# Patient Record
Sex: Male | Born: 1975 | Race: White | Hispanic: No | Marital: Married | Smoking: Current every day smoker
Health system: Southern US, Community
[De-identification: ages and names within clinical notes are randomized; demographics above are authoritative.]

## PROBLEM LIST (undated history)

## (undated) DIAGNOSIS — J40 Bronchitis, not specified as acute or chronic: Secondary | ICD-10-CM

---

## 1998-12-25 ENCOUNTER — Encounter: Payer: Self-pay | Admitting: Emergency Medicine

## 1998-12-25 ENCOUNTER — Emergency Department (HOSPITAL_COMMUNITY): Admission: EM | Admit: 1998-12-25 | Discharge: 1998-12-25 | Payer: Self-pay | Admitting: Emergency Medicine

## 1999-01-18 ENCOUNTER — Ambulatory Visit (HOSPITAL_COMMUNITY): Admission: RE | Admit: 1999-01-18 | Discharge: 1999-01-18 | Payer: Self-pay | Admitting: Urology

## 1999-01-18 ENCOUNTER — Encounter: Payer: Self-pay | Admitting: Urology

## 1999-12-31 ENCOUNTER — Emergency Department (HOSPITAL_COMMUNITY): Admission: EM | Admit: 1999-12-31 | Discharge: 1999-12-31 | Payer: Self-pay | Admitting: Emergency Medicine

## 2000-12-02 ENCOUNTER — Emergency Department (HOSPITAL_COMMUNITY): Admission: EM | Admit: 2000-12-02 | Discharge: 2000-12-02 | Payer: Self-pay | Admitting: Emergency Medicine

## 2000-12-04 ENCOUNTER — Emergency Department (HOSPITAL_COMMUNITY): Admission: EM | Admit: 2000-12-04 | Discharge: 2000-12-04 | Payer: Self-pay

## 2000-12-05 ENCOUNTER — Emergency Department (HOSPITAL_COMMUNITY): Admission: EM | Admit: 2000-12-05 | Discharge: 2000-12-05 | Payer: Self-pay | Admitting: Emergency Medicine

## 2001-10-19 ENCOUNTER — Emergency Department (HOSPITAL_COMMUNITY): Admission: EM | Admit: 2001-10-19 | Discharge: 2001-10-19 | Payer: Self-pay | Admitting: Emergency Medicine

## 2002-08-24 ENCOUNTER — Emergency Department (HOSPITAL_COMMUNITY): Admission: EM | Admit: 2002-08-24 | Discharge: 2002-08-24 | Payer: Self-pay | Admitting: Emergency Medicine

## 2002-08-24 ENCOUNTER — Encounter: Payer: Self-pay | Admitting: Emergency Medicine

## 2002-08-29 ENCOUNTER — Emergency Department (HOSPITAL_COMMUNITY): Admission: AD | Admit: 2002-08-29 | Discharge: 2002-08-29 | Payer: Self-pay | Admitting: Emergency Medicine

## 2002-10-11 ENCOUNTER — Emergency Department (HOSPITAL_COMMUNITY): Admission: AD | Admit: 2002-10-11 | Discharge: 2002-10-11 | Payer: Self-pay | Admitting: Emergency Medicine

## 2002-10-11 ENCOUNTER — Encounter: Payer: Self-pay | Admitting: Emergency Medicine

## 2003-01-18 ENCOUNTER — Emergency Department (HOSPITAL_COMMUNITY): Admission: AD | Admit: 2003-01-18 | Discharge: 2003-01-18 | Payer: Self-pay | Admitting: Family Medicine

## 2007-02-13 ENCOUNTER — Emergency Department (HOSPITAL_COMMUNITY): Admission: EM | Admit: 2007-02-13 | Discharge: 2007-02-13 | Payer: Self-pay | Admitting: Emergency Medicine

## 2007-06-29 ENCOUNTER — Emergency Department (HOSPITAL_COMMUNITY): Admission: EM | Admit: 2007-06-29 | Discharge: 2007-06-29 | Payer: Self-pay | Admitting: Emergency Medicine

## 2008-03-28 ENCOUNTER — Emergency Department (HOSPITAL_COMMUNITY): Admission: EM | Admit: 2008-03-28 | Discharge: 2008-03-28 | Payer: Self-pay | Admitting: Emergency Medicine

## 2008-10-31 ENCOUNTER — Emergency Department (HOSPITAL_COMMUNITY): Admission: EM | Admit: 2008-10-31 | Discharge: 2008-10-31 | Payer: Self-pay | Admitting: Emergency Medicine

## 2008-11-21 ENCOUNTER — Emergency Department (HOSPITAL_COMMUNITY): Admission: EM | Admit: 2008-11-21 | Discharge: 2008-11-21 | Payer: Self-pay | Admitting: Emergency Medicine

## 2010-06-07 LAB — URINALYSIS, ROUTINE W REFLEX MICROSCOPIC
Bilirubin Urine: NEGATIVE
Glucose, UA: NEGATIVE mg/dL
Hgb urine dipstick: NEGATIVE
Ketones, ur: NEGATIVE mg/dL
Nitrite: NEGATIVE
Protein, ur: NEGATIVE mg/dL
Specific Gravity, Urine: 1.022 (ref 1.005–1.030)
Urobilinogen, UA: 1 mg/dL (ref 0.0–1.0)
pH: 7 (ref 5.0–8.0)

## 2010-06-07 LAB — BASIC METABOLIC PANEL
BUN: 9 mg/dL (ref 6–23)
CO2: 25 mEq/L (ref 19–32)
Calcium: 9.3 mg/dL (ref 8.4–10.5)
Chloride: 106 mEq/L (ref 96–112)
Creatinine, Ser: 0.78 mg/dL (ref 0.4–1.5)
GFR calc Af Amer: >60 mL/min (ref >60)
GFR calc non Af Amer: >60 mL/min (ref >60)
Glucose, Bld: 100 mg/dL — ABNORMAL HIGH (ref 70–99)
Potassium: 3.8 mEq/L (ref 3.5–5.1)
Sodium: 140 mEq/L (ref 135–145)

## 2010-12-09 LAB — RAPID STREP SCREEN (MED CTR MEBANE ONLY): Streptococcus, Group A Screen (Direct): POSITIVE — AB

## 2013-01-29 ENCOUNTER — Emergency Department (HOSPITAL_COMMUNITY): Payer: Self-pay

## 2013-01-29 ENCOUNTER — Emergency Department (HOSPITAL_COMMUNITY)
Admission: EM | Admit: 2013-01-29 | Discharge: 2013-01-30 | Disposition: A | Discharge status: Home / Self Care | Payer: Self-pay | Attending: Emergency Medicine | Admitting: Emergency Medicine

## 2013-01-29 ENCOUNTER — Encounter (HOSPITAL_COMMUNITY): Payer: Self-pay | Admitting: Emergency Medicine

## 2013-01-29 DIAGNOSIS — Y9383 Activity, rough housing and horseplay: Secondary | ICD-10-CM | POA: Insufficient documentation

## 2013-01-29 DIAGNOSIS — Y929 Unspecified place or not applicable: Secondary | ICD-10-CM | POA: Insufficient documentation

## 2013-01-29 DIAGNOSIS — S2239XA Fracture of one rib, unspecified side, initial encounter for closed fracture: Secondary | ICD-10-CM | POA: Insufficient documentation

## 2013-01-29 DIAGNOSIS — F17200 Nicotine dependence, unspecified, uncomplicated: Secondary | ICD-10-CM | POA: Insufficient documentation

## 2013-01-29 DIAGNOSIS — W1809XA Striking against other object with subsequent fall, initial encounter: Secondary | ICD-10-CM | POA: Insufficient documentation

## 2013-01-29 DIAGNOSIS — R062 Wheezing: Secondary | ICD-10-CM

## 2013-01-29 DIAGNOSIS — S2231XA Fracture of one rib, right side, initial encounter for closed fracture: Secondary | ICD-10-CM

## 2013-01-29 HISTORY — DX: Bronchitis, not specified as acute or chronic: J40

## 2013-01-29 MED ORDER — OXYCODONE-ACETAMINOPHEN 5-325 MG PO TABS: 1.0000 | ORAL_TABLET | Freq: Four times a day (QID) | ORAL | Status: DC | PRN

## 2013-01-29 MED ORDER — HYDROMORPHONE HCL PF 2 MG/ML IJ SOLN
2.0000 mg | Freq: Once | INTRAMUSCULAR | Status: AC
  Administered 2013-01-29: 2 mg via INTRAMUSCULAR
  Filled 2013-01-29: qty 1

## 2013-01-29 MED ORDER — ALBUTEROL SULFATE HFA 108 (90 BASE) MCG/ACT IN AERS
1.0000 | INHALATION_SPRAY | Freq: Four times a day (QID) | RESPIRATORY_TRACT | Status: DC | PRN
  Administered 2013-01-29: 2 via RESPIRATORY_TRACT
  Filled 2013-01-29: qty 6.7

## 2013-01-29 NOTE — ED Notes (Addendum)
Pt. accidentally hit his right chest against a brick window while " horse playing " this evening , presents with right anterior ribcage pain , no deformity/crepitance , respirations unlabored .

## 2013-01-29 NOTE — ED Provider Notes (Addendum)
CSN: [id number]     Arrival date & time 01/29/13  2126 History   First MD Initiated Contact with Patient 01/29/13 2300     Chief Complaint  Patient presents with  . Rib Injury   (Consider location/radiation/quality/duration/timing/severity/associated sxs/prior Treatment) HPI Comments: Pt used to be on meds for wheezing and bronchitis, is a current smoker, lost insurance and no longer on meds  Patient is a 37 y.o. male presenting with chest pain. The history is provided by the patient and the spouse.  Chest Pain Pain location:  R chest Pain quality: sharp and stabbing   Pain radiates to:  Does not radiate Pain radiates to the back: no   Pain severity:  Severe Onset quality:  Sudden Timing:  Constant Progression:  Unchanged Chronicity:  New Context: trauma   Relieved by:  Nothing Worsened by:  Coughing, deep breathing and movement Ineffective treatments:  None tried Associated symptoms: no abdominal pain, no cough, no diaphoresis, no dizziness, no fever, no nausea, no near-syncope, no shortness of breath, not vomiting and no weakness   Risk factors: male sex and smoking     Past Medical History  Diagnosis Date  . Bronchitis    History reviewed. No pertinent past surgical history. History reviewed. No pertinent family history. History  Substance Use Topics  . Smoking status: Current Every Day Smoker  . Smokeless tobacco: Not on file  . Alcohol Use: Yes    Review of Systems  Constitutional: Negative for fever and diaphoresis.  Respiratory: Positive for wheezing. Negative for cough and shortness of breath.   Cardiovascular: Positive for chest pain. Negative for near-syncope.  Gastrointestinal: Negative for nausea, vomiting and abdominal pain.  Neurological: Negative for dizziness and weakness.  All other systems reviewed and are negative.    Allergies  Review of patient's allergies indicates no known allergies.  Home Medications   Current Outpatient Rx  Name   Route  Sig  Dispense  Refill  . oxyCODONE-acetaminophen (PERCOCET/ROXICET) 5-325 MG per tablet   Oral   Take 1-2 tablets by mouth every 6 (six) hours as needed for severe pain.   30 tablet   0    BP 122/89  Pulse 83  Temp(Src) 98.1 F (36.7 C) (Oral)  Resp 18  Ht 6' (1.829 m)  Wt 220 lb (99.791 kg)  BMI 29.83 kg/m2  SpO2 98% Physical Exam  Nursing note and vitals reviewed. Constitutional: He appears well-developed and well-nourished. He appears distressed.  HENT:  Head: Normocephalic and atraumatic.  Pulmonary/Chest: Effort normal. He has wheezes. He has no rales. He exhibits tenderness and bony tenderness. He exhibits no crepitus, no deformity and no retraction.    Abdominal: Soft. Normal appearance. He exhibits no distension. There is tenderness in the right upper quadrant and right lower quadrant. There is no rebound, no guarding and no CVA tenderness.  Skin: He is not diaphoretic.    ED Course  Procedures (including critical care time) Labs Review Labs Reviewed - No data to display Imaging Review Dg Ribs Unilateral W/chest Right  01/29/2013   CLINICAL DATA:  Anterior rib pain after injury today.  EXAM: RIGHT RIBS AND CHEST - 3+ VIEW  COMPARISON:  None.  FINDINGS: Shallow inspiration. Normal heart size and pulmonary vascularity. No focal airspace disease in the lungs. No blunting of costophrenic angles. No pneumothorax. Suggestion of fracture of the right lateral 10th rib which is best seen on the chest view. Right ribs appear otherwise intact.  IMPRESSION: Nondisplaced fracture of the right  lateral 10th rib. No evidence of active pulmonary disease.   Electronically Signed   By: Burman Nieves M.D.   On: 01/29/2013 22:58    EKG Interpretation   None      ra sat is 98% and I interpret to be adequate MDM   1. Rib fracture, right, closed, initial encounter   2. Wheezing      Pt with focal tenderness in right ribcage after he fell and hurt his right anterior and  lateral lower chest wall, hurts worse with coughing.  Plain films reveal a non displaced 10th rib fracture on the right per radiologist.  No PTX.  Will give inhaler since pt is already wheezing, likely due to his smoking.  Will give a work note as well, Rx for pain meds.    Gavin Pound. Oletta Lamas, MD 01/29/13 2315  Gavin Pound. Abdullah Rizzi, MD 01/29/13 2316

## 2013-01-29 NOTE — Discharge Instructions (Signed)
 Using Your Inhaler Proper inhaler technique is very important. Good technique assures that the medicine reaches the lungs. Poor technique results in depositing the medicine on the tongue and back of the throat rather than in the airways. STEPS TO FOLLOW IF USING INHALER WITHOUT EXTENSION TUBE: 1. Remove cap from inhaler. 2. Shake inhaler for 5 seconds before each inhalation (breathing in). 3. Position the inhaler so that the top of the canister faces up. 4. Put your index finger on the top of the medication canister. Your thumb supports the bottom of the inhaler. 5. Open your mouth. 6. Hold the inhaler 1 to 2 inches away from your open mouth. This allows the medicine to slow down before the medicine enters the mouth. 7. Exhale (breathe out) normally and as completely as possible. 8. Press the canister down with the index finger to release the medication. 9. At the same time as the canister is pressed, inhale deeply and slowly until the lungs are completely filled. This should take 4 to 6 seconds. Keep your tongue down and out of the way. 10. Hold the medication in your lungs for up to 10 seconds (10 seconds is best). This helps the medicine get into the small airways of your lungs to work better. 11. Breathe out slowly, through pursed lips. Whistling is an example of pursed lips. 12. Wait at least 1 minute between puffs. Continue with the above steps until you have taken the number of puffs your caregiver has ordered. 13. Replace cap on inhaler. STEPS TO FOLLOW USING AN INHALER WITH AN EXTENSION (SPACER): 1.  Remove cap from inhaler. 2. Shake inhaler for 5 seconds before each inhalation (breathing in). 3. Your caregiver has asked you to use a spacer with your inhaler. A spacer is a plastic tube with a mouthpiece on one end and an opening that connects to the inhaler on the other end. A spacer helps you take the medicine better. 4. Place the open end of the spacer onto the mouthpiece of the  inhaler. 5. Position the inhaler so that the top of the canister faces up and the spacer mouthpiece faces you. 6. Put your index finger on the top of the medication canister. Your thumb supports the bottom of the inhaler and the spacer. 7. Exhale (breathe out) normally and as completely as possible. 8. Immediately after exhaling, place the spacer between your teeth and into your mouth. Close your mouth tightly around the spacer. 9. Press the canister down with the index finger to release the medication. 10. At the same time as the canister is pressed, inhale deeply and slowly until the lungs are completely filled. This should take 4 to 6 seconds. Keep your tongue down and out of the way. 11. Hold the medication in your lungs for up to 10 seconds (10 seconds is best). This helps the medicine get into the small airways of your lungs to work better. Exhale. 12. Repeat inhaling deeply through the spacer mouthpiece. Again hold that breath for up to 10 seconds (10 seconds is best). Exhale slowly. If it is difficult to take this second deep breath through the spacer, breathe normally several times through the spacer. Remove the spacer from your mouth. 13. Wait at least 1 minute between puffs. Continue with the above steps until you have taken the number of puffs your caregiver has ordered. 14. Remove spacer from the inhaler and place cap on inhaler. If you are using different kinds of inhalers, use your quick relief medicine to  open the airways 10 - 15 minutes before using a steroid. If you are unsure which inhalers to use and the order of using them, ask your caregiver, nurse, or respiratory therapist. If you are using a steroid inhaler, rinse your mouth with water after your last puff and then spit out the water. Do not swallow the water. Avoid the following:  Inhaling before or after starting the spray of medicine. It takes practice to coordinate your breathing with triggering the spray.  Inhaling  through the nose (rather than the mouth) when triggering the spray. HOW TO DETERMINE IF YOUR INHALER IS FULL OR NEARLY EMPTY:  Determine when an inhaler is empty. You cannot know when an inhaler is empty by shaking it. A few inhalers are now being made with dose counters. Ask your caregiver for a prescription that has a dose counter if you feel you need that extra help.  If your inhaler does not have a counter, check the number of doses in the inhaler before you use it. The canister or box will list the number of doses in the canister. Divide the total number of doses in the canister by the number you will use each day to find how many days the canister will last. (For example, if your canister has 200 doses and you take 2 puffs, 4 times each day, which is 8 puffs a day. Dividing 200 by 8 equals 25. The canister should last 25 days.) Using a calendar, count forward that many days to see when your inhaler will run out. Write the refill date on a calendar or your canister.  Remember, if you need to take extra doses, the inhaler will empty sooner than you figured. Be sure you have a refill before your canister runs out. Refill your inhaler 7 to 10 days before it runs out. HOME CARE INSTRUCTIONS   Do not use the inhaler more than your caregiver tells you. If you are still wheezing and are feeling tightness in your chest, call your caregiver.  Keep an adequate supply of medication. This includes making sure the medicine is not expired, and you have a spare inhaler.  Follow your caregiver or inhaler insert directions for cleaning the inhaler and spacer. SEEK MEDICAL CARE IF:   Symptoms are only partially relieved with your inhaler.  You are having trouble using your inhaler.  You experience some increase in phlegm.  You develop a fever of 100.5 F (38.1 C). SEEK IMMEDIATE MEDICAL CARE IF:   You feel little or no relief with your inhalers. You are still wheezing and are feeling shortness of  breath and/or tightness in your chest.  You have side effects such as dizziness, headaches or fast heart rate.  You have chills, fever, night sweats or an oral temperature above 102 F (38.9 C).  Phlegm production increases a lot, or there is blood in the phlegm. MAKE SURE YOU:   Understand these instructions.  Will watch your condition.  Will get help right away if you are not doing well or get worse. Document Released: 02/15/2000 Document Revised: 05/12/2011 Document Reviewed: 12/05/2008 [facility] Patient Information 2014 [place], [place].    Rib Fracture A rib fracture is a break or crack in one of the bones of the ribs. The ribs are a group of long, curved bones that wrap around your chest and attach to your spine. They protect your lungs and other organs in the chest cavity. A broken or cracked rib is often painful, but most do  not cause other problems. Most rib fractures heal on their own over time. However, rib fractures can be more serious if multiple ribs are broken or if broken ribs move out of place and push against other structures. CAUSES   A direct blow to the chest. For example, this could happen during contact sports, a car accident, or a fall against a hard object.  Repetitive movements with high force, such as pitching a baseball or having severe coughing spells. SYMPTOMS   Pain when you breathe in or cough.  Pain when someone presses on the injured area. DIAGNOSIS  Your caregiver will perform a physical exam. Various imaging tests may be ordered to confirm the diagnosis and to look for related injuries. These tests may include a chest X-ray, computed tomography (CT), magnetic resonance imaging (MRI), or a bone scan. TREATMENT  Rib fractures usually heal on their own in 1 3 months. The longer healing period is often associated with a continued cough or other aggravating activities. During the healing period, pain control is very important. Medication is usually  given to control pain. Hospitalization or surgery may be needed for more severe injuries, such as those in which multiple ribs are broken or the ribs have moved out of place.  HOME CARE INSTRUCTIONS   Avoid strenuous activity and any activities or movements that cause pain. Be careful during activities and avoid bumping the injured rib.  Gradually increase activity as directed by your caregiver.  Only take over-the-counter or prescription medications as directed by your caregiver. Do not take other medications without asking your caregiver first.  Apply ice to the injured area for the first 1 2 days after you have been treated or as directed by your caregiver. Applying ice helps to reduce inflammation and pain.  Put ice in a plastic bag.  Place a towel between your skin and the bag.   Leave the ice on for 15 20 minutes at a time, every 2 hours while you are awake.  Perform deep breathing as directed by your caregiver. This will help prevent pneumonia, which is a common complication of a broken rib. Your caregiver may instruct you to:  Take deep breaths several times a day.  Try to cough several times a day, holding a pillow against the injured area.  Use a device called an incentive spirometer to practice deep breathing several times a day.  Drink enough fluids to keep your urine clear or pale yellow. This will help you avoid constipation.   Do not wear a rib belt or binder. These restrict breathing, which can lead to pneumonia.  SEEK IMMEDIATE MEDICAL CARE IF:   You have a fever.   You have difficulty breathing or shortness of breath.   You develop a continual cough, or you cough up thick or bloody sputum.  You feel sick to your stomach (nausea), throw up (vomit), or have abdominal pain.   You have worsening pain not controlled with medications.  MAKE SURE YOU:  Understand these instructions.  Will watch your condition.  Will get help right away if you are not  doing well or get worse. Document Released: 02/17/2005 Document Revised: 10/20/2012 Document Reviewed: 04/21/2012 [facility] Patient Information 2014 [place], [place].    Narcotic and benzodiazepine use may cause drowsiness, slowed breathing or dependence.  Please use with caution and do not drive, operate machinery or watch young children alone while taking them.  Taking combinations of these medications or drinking alcohol will potentiate these effects.

## 2013-01-30 NOTE — ED Notes (Signed)
Patient has ride home with family 

## 2013-02-21 ENCOUNTER — Ambulatory Visit: Payer: Self-pay

## 2013-02-22 ENCOUNTER — Ambulatory Visit: Payer: Self-pay | Attending: Internal Medicine | Admitting: Internal Medicine

## 2013-02-22 VITALS — BP 136/84 | HR 83 | Temp 98.4°F | Resp 16 | Ht 72.0 in | Wt 223.0 lb

## 2013-02-22 DIAGNOSIS — S2249XA Multiple fractures of ribs, unspecified side, initial encounter for closed fracture: Secondary | ICD-10-CM

## 2013-02-22 DIAGNOSIS — S2239XA Fracture of one rib, unspecified side, initial encounter for closed fracture: Secondary | ICD-10-CM | POA: Insufficient documentation

## 2013-02-22 DIAGNOSIS — X58XXXA Exposure to other specified factors, initial encounter: Secondary | ICD-10-CM | POA: Insufficient documentation

## 2013-02-22 DIAGNOSIS — S2241XA Multiple fractures of ribs, right side, initial encounter for closed fracture: Secondary | ICD-10-CM

## 2013-02-22 LAB — COMPLETE METABOLIC PANEL WITH GFR
ALT: 24 U/L (ref 0–53)
AST: 20 U/L (ref 0–37)
Alkaline Phosphatase: 57 U/L (ref 39–117)
Calcium: 9.8 mg/dL (ref 8.4–10.5)
Chloride: 102 mEq/L (ref 96–112)
Creat: 0.72 mg/dL (ref 0.50–1.35)

## 2013-02-22 LAB — CBC WITH DIFFERENTIAL/PLATELET
Eosinophils Absolute: 0.4 10*3/uL (ref 0.0–0.7)
Eosinophils Relative: 4 % (ref 0–5)
HCT: 47 % (ref 39.0–52.0)
Hemoglobin: 16.1 g/dL (ref 13.0–17.0)
Lymphocytes Relative: 26 % (ref 12–46)
Lymphs Abs: 2.9 10*3/uL (ref 0.7–4.0)
MCH: 30.4 pg (ref 26.0–34.0)
MCV: 88.7 fL (ref 78.0–100.0)
Monocytes Absolute: 0.8 10*3/uL (ref 0.1–1.0)
Monocytes Relative: 7 % (ref 3–12)
Platelets: 203 10*3/uL (ref 150–400)
RBC: 5.3 MIL/uL (ref 4.22–5.81)
WBC: 11 10*3/uL — ABNORMAL HIGH (ref 4.0–10.5)

## 2013-02-22 LAB — LIPID PANEL
HDL: 42 mg/dL (ref >39)
LDL Cholesterol: 129 mg/dL — ABNORMAL HIGH (ref 0–99)
Total CHOL/HDL Ratio: 4.6 Ratio
Triglycerides: 119 mg/dL (ref <150)
VLDL: 24 mg/dL (ref 0–40)

## 2013-02-22 LAB — TSH: TSH: 2.994 u[IU]/mL (ref 0.350–4.500)

## 2013-02-22 MED ORDER — DICLOFENAC SODIUM 3 % TD GEL: 1.0000 | Freq: Two times a day (BID) | TRANSDERMAL | Status: DC

## 2013-02-22 NOTE — Progress Notes (Signed)
Pt is here to establish care. Pt broke his ribs 4 weeks ago and he is following up care here.

## 2013-02-22 NOTE — Progress Notes (Signed)
Patient ID: Frank Chavez, male   DOB: 1975-11-06, 37 y.o.   MRN: [id number]   CC:  HPI: 37 year old male presenting to establish care. He was recently in the ER with a refracture right, nondisplaced 10th rib fracture. No pneumothorax. At that time he had wheezing and was prescribed albuterol inhaler. Now he has localized pain. He has a history of left ureteropelvic junction stenosis. He has not had his kidney function checked in about 4 years.  No Known Allergies Past Medical History  Diagnosis Date  . Bronchitis    Current Outpatient Prescriptions on File Prior to Visit  Medication Sig Dispense Refill  . oxyCODONE-acetaminophen (PERCOCET/ROXICET) 5-325 MG per tablet Take 1-2 tablets by mouth every 6 (six) hours as needed for severe pain.  30 tablet  0   No current facility-administered medications on file prior to visit.   Family History  Problem Relation Age of Onset  . Hypertension Mother   . Hypertension Father    History   Social History  . Marital Status: Married    Spouse Name: N/A    Number of Children: N/A  . Years of Education: N/A   Occupational History  . Not on file.   Social History Main Topics  . Smoking status: Current Every Day Smoker  . Smokeless tobacco: Not on file  . Alcohol Use: Yes  . Drug Use: No  . Sexual Activity: Not on file   Other Topics Concern  . Not on file   Social History Narrative  . No narrative on file    Review of Systems  Constitutional: Negative for fever, chills, diaphoresis, activity change, appetite change and fatigue.  HENT: Negative for ear pain, nosebleeds, congestion, facial swelling, rhinorrhea, neck pain, neck stiffness and ear discharge.   Eyes: Negative for pain, discharge, redness, itching and visual disturbance.  Respiratory: Negative for cough, choking, chest tightness, shortness of breath, wheezing and stridor.   Cardiovascular: Positive for chest pain, palpitations and leg swelling.  Gastrointestinal:  Negative for abdominal distention.  Genitourinary: Negative for dysuria, urgency, frequency, hematuria, flank pain, decreased urine volume, difficulty urinating and dyspareunia.  Musculoskeletal: Negative for back pain, joint swelling, arthralgias and gait problem.  Neurological: Negative for dizziness, tremors, seizures, syncope, facial asymmetry, speech difficulty, weakness, light-headedness, numbness and headaches.  Hematological: Negative for adenopathy. Does not bruise/bleed easily.  Psychiatric/Behavioral: Negative for hallucinations, behavioral problems, confusion, dysphoric mood, decreased concentration and agitation.    Objective:   Filed Vitals:   02/22/13 1411  BP: 136/84  Pulse: 83  Temp: 98.4 F (36.9 C)  Resp: 16    Physical Exam  Constitutional: Appears well-developed and well-nourished. No distress.  HENT: Normocephalic. External right and left ear normal. Oropharynx is clear and moist.  Eyes: Conjunctivae and EOM are normal. PERRLA, no scleral icterus.  Neck: Normal ROM. Neck supple. No JVD. No tracheal deviation. No thyromegaly.  CVS: RRR, S1/S2 +, no murmurs, no gallops, no carotid bruit.  Pulmonary: Effort and breath sounds normal, no stridor, rhonchi, wheezes, rales.  Abdominal: Soft. BS +,  no distension, tenderness, rebound or guarding.  Musculoskeletal: Normal range of motion. No edema and no tenderness.  Lymphadenopathy: No lymphadenopathy noted, cervical, inguinal. Neuro: Alert. Normal reflexes, muscle tone coordination. No cranial nerve deficit. Skin: Skin is warm and dry. No rash noted. Not diaphoretic. No erythema. No pallor.  Psychiatric: Normal mood and affect. Behavior, judgment, thought content normal.   No results found for this basename: WBC, HGB, HCT, MCV, PLT  Lab Results  Component Value Date   CREATININE 0.78 11/21/2008   BUN 9 11/21/2008   NA 140 11/21/2008   K 3.8 SLIGHT HEMOLYSIS 11/21/2008   CL 106 11/21/2008   CO2 25 11/21/2008    No  results found for this basename: HGBA1C   Lipid Panel  No results found for this basename: chol, trig, hdl, cholhdl, vldl, ldlcalc       Assessment and plan:   There are no active problems to display for this patient.   Rib fracture We'll repeat x-rays to rule out underlying pneumothorax, document healing Diclofenac gel to be applied locally Counseled about that of spirometry Report any worsening chest pain or shortness of breath or fever  Establish care Will do baseline labs Refusing flu vaccination Counseled about smoking cessation   The patient was given clear instructions to go to ER or return to medical center if symptoms don't improve, worsen or new problems develop. The patient verbalized understanding. The patient was told to call to get any lab results if not heard anything in the next week.

## 2013-02-23 ENCOUNTER — Ambulatory Visit (HOSPITAL_COMMUNITY)
Admission: RE | Admit: 2013-02-23 | Discharge: 2013-02-23 | Disposition: A | Discharge status: Home / Self Care | Payer: Self-pay | Source: Ambulatory Visit | Attending: Internal Medicine | Admitting: Internal Medicine

## 2013-02-23 DIAGNOSIS — S2239XA Fracture of one rib, unspecified side, initial encounter for closed fracture: Secondary | ICD-10-CM | POA: Insufficient documentation

## 2013-02-23 DIAGNOSIS — X58XXXA Exposure to other specified factors, initial encounter: Secondary | ICD-10-CM | POA: Insufficient documentation

## 2013-02-23 DIAGNOSIS — S2241XA Multiple fractures of ribs, right side, initial encounter for closed fracture: Secondary | ICD-10-CM

## 2013-02-23 MED ORDER — DICLOFENAC SODIUM 3 % TD GEL: 1.0000 | Freq: Two times a day (BID) | TRANSDERMAL | Status: AC

## 2013-02-23 NOTE — Addendum Note (Signed)
Addended by: Susie Cassette MD, Germain Osgood on: 02/23/2013 09:48 AM   Modules accepted: Orders

## 2013-02-28 ENCOUNTER — Telehealth: Payer: Self-pay | Admitting: *Deleted

## 2013-02-28 NOTE — Telephone Encounter (Signed)
Message copied by Naftula Donahue, [place] R on Mon Feb 28, 2013  3:22 PM ------      Message from: Susie Cassette MD, [facility]      Created: Mon Feb 28, 2013  3:11 PM       Please notify patient, that all labs are within normal limits ------

## 2013-02-28 NOTE — Telephone Encounter (Signed)
Pt called back and was given his lab results. Call completed effectively.

## 2013-02-28 NOTE — Telephone Encounter (Signed)
Contacted pt to inform him of his lab results. Left a voicemail for the pt to give [place] a call back.

## 2013-05-23 ENCOUNTER — Ambulatory Visit: Payer: Self-pay | Attending: Internal Medicine | Admitting: Internal Medicine

## 2013-05-23 ENCOUNTER — Encounter: Payer: Self-pay | Admitting: Internal Medicine

## 2013-05-23 VITALS — BP 131/81 | HR 84 | Temp 98.7°F | Resp 16 | Wt 244.4 lb

## 2013-05-23 DIAGNOSIS — F17200 Nicotine dependence, unspecified, uncomplicated: Secondary | ICD-10-CM | POA: Insufficient documentation

## 2013-05-23 DIAGNOSIS — Z09 Encounter for follow-up examination after completed treatment for conditions other than malignant neoplasm: Secondary | ICD-10-CM

## 2013-05-23 DIAGNOSIS — E785 Hyperlipidemia, unspecified: Secondary | ICD-10-CM | POA: Insufficient documentation

## 2013-05-23 DIAGNOSIS — IMO0001 Reserved for inherently not codable concepts without codable children: Secondary | ICD-10-CM | POA: Insufficient documentation

## 2013-05-23 MED ORDER — NICOTINE 21 MG/24HR TD PT24: 21.0000 mg | MEDICATED_PATCH | Freq: Every day | TRANSDERMAL | Status: AC

## 2013-05-23 NOTE — Patient Instructions (Signed)
Fat and Cholesterol Control Diet  Fat and cholesterol levels in your blood and organs are influenced by your diet. High levels of fat and cholesterol may lead to diseases of the heart, small and large blood vessels, gallbladder, liver, and pancreas.  CONTROLLING FAT AND CHOLESTEROL WITH DIET  Although exercise and lifestyle factors are important, your diet is key. That is because certain foods are known to raise cholesterol and others to lower it. The goal is to balance foods for their effect on cholesterol and more importantly, to replace saturated and trans fat with other types of fat, such as monounsaturated fat, polyunsaturated fat, and omega-3 fatty acids.  On average, a person should consume no more than 15 to 17 g of saturated fat daily. Saturated and trans fats are considered "bad" fats, and they will raise LDL cholesterol. Saturated fats are primarily found in animal products such as meats, butter, and cream. However, that does not mean you need to give up all your favorite foods. Today, there are good tasting, low-fat, low-cholesterol substitutes for most of the things you like to eat. Choose low-fat or nonfat alternatives. Choose round or loin cuts of red meat. These types of cuts are lowest in fat and cholesterol. Chicken (without the skin), fish, veal, and ground turkey breast are great choices. Eliminate fatty meats, such as hot dogs and salami. Even shellfish have little or no saturated fat. Have a 3 oz (85 g) portion when you eat lean meat, poultry, or fish.  Trans fats are also called "partially hydrogenated oils." They are oils that have been scientifically manipulated so that they are solid at room temperature resulting in a longer shelf life and improved taste and texture of foods in which they are added. Trans fats are found in stick margarine, some tub margarines, cookies, crackers, and baked goods.   When baking and cooking, oils are a great substitute for butter. The monounsaturated oils are  especially beneficial since it is believed they lower LDL and raise HDL. The oils you should avoid entirely are saturated tropical oils, such as coconut and palm.   Remember to eat a lot from food groups that are naturally free of saturated and trans fat, including fish, fruit, vegetables, beans, grains (barley, rice, couscous, bulgur wheat), and pasta (without cream sauces).   IDENTIFYING FOODS THAT LOWER FAT AND CHOLESTEROL   Soluble fiber may lower your cholesterol. This type of fiber is found in fruits such as apples, vegetables such as broccoli, potatoes, and carrots, legumes such as beans, peas, and lentils, and grains such as barley. Foods fortified with plant sterols (phytosterol) may also lower cholesterol. You should eat at least 2 g per day of these foods for a cholesterol lowering effect.   Read package labels to identify low-saturated fats, trans fat free, and low-fat foods at the supermarket. Select cheeses that have only 2 to 3 g saturated fat per ounce. Use a heart-healthy tub margarine that is free of trans fats or partially hydrogenated oil. When buying baked goods (cookies, crackers), avoid partially hydrogenated oils. Breads and muffins should be made from whole grains (whole-wheat or whole oat flour, instead of "flour" or "enriched flour"). Buy non-creamy canned soups with reduced salt and no added fats.   FOOD PREPARATION TECHNIQUES   Never deep-fry. If you must fry, either stir-fry, which uses very little fat, or use non-stick cooking sprays. When possible, broil, bake, or roast meats, and steam vegetables. Instead of putting butter or margarine on vegetables, use lemon   and herbs, applesauce, and cinnamon (for squash and sweet potatoes). Use nonfat yogurt, salsa, and low-fat dressings for salads.   LOW-SATURATED FAT / LOW-FAT FOOD SUBSTITUTES  Meats / Saturated Fat (g)  · Avoid: Steak, marbled (3 oz/85 g) / 11 g  · Choose: Steak, lean (3 oz/85 g) / 4 g  · Avoid: Hamburger (3 oz/85 g) / 7  g  · Choose: Hamburger, lean (3 oz/85 g) / 5 g  · Avoid: Ham (3 oz/85 g) / 6 g  · Choose: Ham, lean cut (3 oz/85 g) / 2.4 g  · Avoid: Chicken, with skin, dark meat (3 oz/85 g) / 4 g  · Choose: Chicken, skin removed, dark meat (3 oz/85 g) / 2 g  · Avoid: Chicken, with skin, light meat (3 oz/85 g) / 2.5 g  · Choose: Chicken, skin removed, light meat (3 oz/85 g) / 1 g  Dairy / Saturated Fat (g)  · Avoid: Whole milk (1 cup) / 5 g  · Choose: Low-fat milk, 2% (1 cup) / 3 g  · Choose: Low-fat milk, 1% (1 cup) / 1.5 g  · Choose: Skim milk (1 cup) / 0.3 g  · Avoid: Hard cheese (1 oz/28 g) / 6 g  · Choose: Skim milk cheese (1 oz/28 g) / 2 to 3 g  · Avoid: Cottage cheese, 4% fat (1 cup) / 6.5 g  · Choose: Low-fat cottage cheese, 1% fat (1 cup) / 1.5 g  · Avoid: Ice cream (1 cup) / 9 g  · Choose: Sherbet (1 cup) / 2.5 g  · Choose: Nonfat frozen yogurt (1 cup) / 0.3 g  · Choose: Frozen fruit bar / trace  · Avoid: Whipped cream (1 tbs) / 3.5 g  · Choose: Nondairy whipped topping (1 tbs) / 1 g  Condiments / Saturated Fat (g)  · Avoid: Mayonnaise (1 tbs) / 2 g  · Choose: Low-fat mayonnaise (1 tbs) / 1 g  · Avoid: Butter (1 tbs) / 7 g  · Choose: Extra light margarine (1 tbs) / 1 g  · Avoid: Coconut oil (1 tbs) / 11.8 g  · Choose: Olive oil (1 tbs) / 1.8 g  · Choose: Corn oil (1 tbs) / 1.7 g  · Choose: Safflower oil (1 tbs) / 1.2 g  · Choose: Sunflower oil (1 tbs) / 1.4 g  · Choose: Soybean oil (1 tbs) / 2.4 g  · Choose: Canola oil (1 tbs) / 1 g  Document Released: 02/17/2005 Document Revised: 06/14/2012 Document Reviewed: 08/08/2010  [employer]® Patient Information ©2014 [employer].

## 2013-05-23 NOTE — Progress Notes (Signed)
Patient here for follow up-fractured ribs to right side

## 2013-05-23 NOTE — Progress Notes (Signed)
MRN: [id number] Name: Frank Chavez  Sex: male Age: 38 y.o. DOB: 06-12-1975  Allergies: Review of patient's allergies indicates no known allergies.  Chief Complaint  Patient presents with  . Follow-up    HPI: Patient is 38 y.o. male who comes today for followup, has history of a rib fracture in the past followup x-ray showed improvement, denies any pain currently,, blood work reviewed noticed to have hyperlipidemia, patient also smokes cigarettes, advised patient to quit smoking.  Past Medical History  Diagnosis Date  . Bronchitis     History reviewed. No pertinent past surgical history.    Medication List       This list is accurate as of: 05/23/13  2:49 PM.  Always use your most recent med list.               Diclofenac Sodium 3 % Gel  Place 1 Tube onto the skin 2 (two) times daily.     nicotine 21 mg/24hr patch  Commonly known as:  NICODERM CQ  Place 1 patch (21 mg total) onto the skin daily.     oxyCODONE-acetaminophen 5-325 MG per tablet  Commonly known as:  PERCOCET/ROXICET  Take 1-2 tablets by mouth every 6 (six) hours as needed for severe pain.        Meds ordered this encounter  Medications  . nicotine (NICODERM CQ) 21 mg/24hr patch    Sig: Place 1 patch (21 mg total) onto the skin daily.    Dispense:  28 patch    Refill:  0     There is no immunization history on file for this patient.  Family History  Problem Relation Age of Onset  . Hypertension Mother   . Hypertension Father     History  Substance Use Topics  . Smoking status: Current Every Day Smoker  . Smokeless tobacco: Not on file  . Alcohol Use: Yes    Review of Systems   As noted in HPI  Filed Vitals:   05/23/13 1427  BP: 131/81  Pulse: 84  Temp: 98.7 F (37.1 C)  Resp: 16    Physical Exam  Physical Exam  Constitutional: No distress.  Cardiovascular: Normal rate and regular rhythm.   Pulmonary/Chest: Breath sounds normal. No respiratory distress. He has no  wheezes. He has no rales.    CBC    Component Value Date/Time   WBC 11.0* 02/22/2013 1437   RBC 5.30 02/22/2013 1437   HGB 16.1 02/22/2013 1437   HCT 47.0 02/22/2013 1437   PLT 203 02/22/2013 1437   MCV 88.7 02/22/2013 1437   LYMPHSABS 2.9 02/22/2013 1437   MONOABS 0.8 02/22/2013 1437   EOSABS 0.4 02/22/2013 1437   BASOSABS 0.1 02/22/2013 1437    CMP     Component Value Date/Time   NA 138 02/22/2013 1437   K 3.8 02/22/2013 1437   CL 102 02/22/2013 1437   CO2 29 02/22/2013 1437   GLUCOSE 68* 02/22/2013 1437   BUN 12 02/22/2013 1437   CREATININE 0.72 02/22/2013 1437   CREATININE 0.78 11/21/2008 0815   CALCIUM 9.8 02/22/2013 1437   PROT 7.6 02/22/2013 1437   ALBUMIN 4.8 02/22/2013 1437   AST 20 02/22/2013 1437   ALT 24 02/22/2013 1437   ALKPHOS 57 02/22/2013 1437   BILITOT 0.4 02/22/2013 1437   GFRNONAA >60 11/21/2008 0815   GFRAA  Value: >60        The eGFR has been calculated using the MDRD equation. This calculation  has not been validated in all clinical situations. eGFR's persistently <60 mL/min signify possible Chronic Kidney Disease. 11/21/2008 0815    Lab Results  Component Value Date/Time   CHOL 195 02/22/2013  2:37 PM    No components found with this basename: hga1c    Lab Results  Component Value Date/Time   AST 20 02/22/2013  2:37 PM    Assessment and Plan  Follow up  Smoking - Plan: nicotine (NICODERM CQ) 21 mg/24hr patch  Other and unspecified hyperlipidemia     Return in about 6 months (around 11/23/2013), or if symptoms worsen or fail to improve, for hyperipidemia.  Lorayne Marek, MD

## 2013-11-23 ENCOUNTER — Ambulatory Visit: Payer: Self-pay | Admitting: Internal Medicine

## 2018-08-05 ENCOUNTER — Other Ambulatory Visit: Payer: Self-pay

## 2018-08-05 ENCOUNTER — Ambulatory Visit (HOSPITAL_COMMUNITY)
Admission: EM | Admit: 2018-08-05 | Discharge: 2018-08-05 | Disposition: A | Discharge status: Home / Self Care | Payer: Self-pay | Attending: Family Medicine | Admitting: Family Medicine

## 2018-08-05 ENCOUNTER — Encounter (HOSPITAL_COMMUNITY): Payer: Self-pay | Admitting: Emergency Medicine

## 2018-08-05 DIAGNOSIS — H44002 Unspecified purulent endophthalmitis, left eye: Secondary | ICD-10-CM

## 2018-08-05 MED ORDER — POLYMYXIN B-TRIMETHOPRIM 10000-0.1 UNIT/ML-% OP SOLN: 1.0000 [drp] | OPHTHALMIC | 0 refills | Status: AC

## 2018-08-05 NOTE — Discharge Instructions (Addendum)
Antibiotic eyedrops use every 4 hours while awake.  You will do this for 7 days. If your symptoms worsen over the next 24 to 48 hours with severe eye swelling or pain you need to be re-seen Keep doing the warm compresses Follow up as needed for continued or worsening symptoms

## 2018-08-05 NOTE — ED Triage Notes (Signed)
PT reports left eye swelling, redness, and drainage for 2 days. PT noticed a "pimple" on eyelid and drained it yesterday. Woke up this morning to more swelling.

## 2018-08-05 NOTE — ED Provider Notes (Signed)
[facility]    CSN: [id number] Arrival date & time: 08/05/18  1206     History   Chief Complaint Chief Complaint  Patient presents with  . Eye Problem    HPI Frank Chavez is a 43 y.o. male.   Pt is a 43 year old male that presents with left eye redness, lid swelling and drainage x 2 days. Reports that he had a stye in the upper eyelid that he scraped with purulent drainage. He woke up this am with eye lids stuck together and had to use warm compress to clean and open the eye. Upper and lower lids swollen. Denies any injury to the eye or foreign body. Mild blurred vision.   ROS per HPI      Past Medical History:  Diagnosis Date  . Bronchitis     Patient Active Problem List   Diagnosis Date Noted  . Smoking 05/23/2013  . Other and unspecified hyperlipidemia 05/23/2013    History reviewed. No pertinent surgical history.     Home Medications    Prior to Admission medications   Medication Sig Start Date End Date Taking? Authorizing Provider  Diclofenac Sodium 3 % GEL Place 1 Tube onto the skin 2 (two) times daily. 02/23/13   Richarda Overlie, MD  nicotine (NICODERM CQ) 21 mg/24hr patch Place 1 patch (21 mg total) onto the skin daily. 05/23/13   Doris Cheadle, MD  oxyCODONE-acetaminophen (PERCOCET/ROXICET) 5-325 MG per tablet Take 1-2 tablets by mouth every 6 (six) hours as needed for severe pain. 01/29/13   Quita Skye, MD  trimethoprim-polymyxin b (POLYTRIM) ophthalmic solution Place 1 drop into the left eye every 4 (four) hours for 7 days. 08/05/18 08/12/18  Janace Aris, NP    Family History Family History  Problem Relation Age of Onset  . Hypertension Mother   . Hypertension Father     Social History Social History   Tobacco Use  . Smoking status: Current Every Day Smoker  Substance Use Topics  . Alcohol use: Yes  . Drug use: No     Allergies   Patient has no known allergies.   Review of Systems Review of Systems   Physical Exam  Triage Vital Signs ED Triage Vitals [08/05/18 1302]  Enc Vitals Group     BP 133/89     Pulse Rate 79     Resp 16     Temp 98.4 F (36.9 C)     Temp Source Oral     SpO2 99 %     Weight      Height      Head Circumference      Peak Flow      Pain Score 5     Pain Loc      Pain Edu?      Excl. in [place]?    No data found.  Updated Vital Signs BP 133/89   Pulse 79   Temp 98.4 F (36.9 C) (Oral)   Resp 16   SpO2 99%   Visual Acuity Right Eye Distance:   Left Eye Distance:   Bilateral Distance:    Right Eye Near:   Left Eye Near:    Bilateral Near:     Physical Exam Vitals signs and nursing note reviewed.  Constitutional:      General: He is not in acute distress.    Appearance: Normal appearance. He is not ill-appearing, toxic-appearing or diaphoretic.  HENT:     Head: Normocephalic and atraumatic.  Nose: Nose normal.     Mouth/Throat:     Pharynx: Oropharynx is clear.  Eyes:     General:        Left eye: Discharge present.    Extraocular Movements: Extraocular movements intact.     Pupils: Pupils are equal, round, and reactive to light.     Comments: Left upper and lower lid swelling and redness of the left eye.  Tearing of the eye. Mild scleral injection Nontender to palpation of globe   Neck:     Musculoskeletal: Normal range of motion.  Pulmonary:     Effort: Pulmonary effort is normal.  Musculoskeletal: Normal range of motion.  Skin:    General: Skin is warm.  Neurological:     Mental Status: He is alert.  Psychiatric:        Mood and Affect: Mood normal.      UC Treatments / Results  Labs (all labs ordered are listed, but only abnormal results are displayed) Labs Reviewed - No data to display  EKG None  Radiology No results found.  Procedures Procedures (including critical care time)  Medications Ordered in UC Medications - No data to display  Initial Impression / Assessment and Plan / UC Course  I have reviewed the triage  vital signs and the nursing notes.  Pertinent labs & imaging results that were available during my care of the patient were reviewed by me and considered in my medical decision making (see chart for details).     Infected stye  Treating with polytrim Instructed to follow-up or return in the next 24 to 48 hours for worsening swelling Otherwise take medication as prescribed for the next 7 days Warm compresses Final Clinical Impressions(s) / UC Diagnoses   Final diagnoses:  Eye infection, left     Discharge Instructions     Antibiotic eyedrops use every 4 hours while awake.  You will do this for 7 days. If your symptoms worsen over the next 24 to 48 hours with severe eye swelling or pain you need to be re-seen Keep doing the warm compresses Follow up as needed for continued or worsening symptoms    ED Prescriptions    Medication Sig Dispense Auth. Provider   trimethoprim-polymyxin b (POLYTRIM) ophthalmic solution Place 1 drop into the left eye every 4 (four) hours for 7 days. 10 mL Dahlia ByesBast, Adeyemi Hamad A, NP     Controlled Substance Prescriptions [place] Controlled Substance Registry consulted? Not Applicable   Janace ArisBast, Rachit Grim A, NP 08/05/18 1335

## 2018-11-14 ENCOUNTER — Emergency Department (HOSPITAL_COMMUNITY): Payer: Self-pay

## 2018-11-14 ENCOUNTER — Encounter (HOSPITAL_COMMUNITY): Payer: Self-pay

## 2018-11-14 ENCOUNTER — Other Ambulatory Visit: Payer: Self-pay

## 2018-11-14 ENCOUNTER — Encounter (HOSPITAL_COMMUNITY): Payer: Self-pay | Admitting: Emergency Medicine

## 2018-11-14 ENCOUNTER — Emergency Department (HOSPITAL_COMMUNITY)
Admission: EM | Admit: 2018-11-14 | Discharge: 2018-11-14 | Disposition: A | Discharge status: Home / Self Care | Payer: Self-pay | Attending: Emergency Medicine | Admitting: Emergency Medicine

## 2018-11-14 ENCOUNTER — Ambulatory Visit (INDEPENDENT_AMBULATORY_CARE_PROVIDER_SITE_OTHER): Payer: Self-pay

## 2018-11-14 ENCOUNTER — Ambulatory Visit (HOSPITAL_COMMUNITY)
Admission: EM | Admit: 2018-11-14 | Discharge: 2018-11-14 | Disposition: A | Discharge status: Other Acute Inpatient Hospital | Payer: Self-pay | Attending: Family Medicine | Admitting: Family Medicine

## 2018-11-14 DIAGNOSIS — Y999 Unspecified external cause status: Secondary | ICD-10-CM | POA: Insufficient documentation

## 2018-11-14 DIAGNOSIS — S3992XA Unspecified injury of lower back, initial encounter: Secondary | ICD-10-CM

## 2018-11-14 DIAGNOSIS — F17200 Nicotine dependence, unspecified, uncomplicated: Secondary | ICD-10-CM | POA: Insufficient documentation

## 2018-11-14 DIAGNOSIS — Y929 Unspecified place or not applicable: Secondary | ICD-10-CM | POA: Insufficient documentation

## 2018-11-14 DIAGNOSIS — Y9301 Activity, walking, marching and hiking: Secondary | ICD-10-CM | POA: Insufficient documentation

## 2018-11-14 DIAGNOSIS — S3210XA Unspecified fracture of sacrum, initial encounter for closed fracture: Secondary | ICD-10-CM | POA: Insufficient documentation

## 2018-11-14 DIAGNOSIS — W0110XA Fall on same level from slipping, tripping and stumbling with subsequent striking against unspecified object, initial encounter: Secondary | ICD-10-CM | POA: Insufficient documentation

## 2018-11-14 MED ORDER — HYDROCODONE-ACETAMINOPHEN 5-325 MG PO TABS
1.0000 | ORAL_TABLET | Freq: Once | ORAL | Status: AC
  Administered 2018-11-14: 13:00:00 1 via ORAL

## 2018-11-14 MED ORDER — HYDROCODONE-ACETAMINOPHEN 5-325 MG PO TABS
ORAL_TABLET | ORAL | Status: AC
  Filled 2018-11-14: qty 1

## 2018-11-14 MED ORDER — TRAMADOL HCL 50 MG PO TABS: 50.0000 mg | ORAL_TABLET | Freq: Four times a day (QID) | ORAL | 0 refills | Status: AC | PRN

## 2018-11-14 NOTE — ED Provider Notes (Signed)
[facility]    CSN: [id number] Arrival date & time: 11/14/18  1026      History   Chief Complaint Chief Complaint  Patient presents with  . Tailbone Injury    HPI Frank Chavez is a 43 y.o. male.   HPI  Tailbone Injury Patient was working 3 days ago and slipped and fell on pavement. Upon impact, he felt the sensation of something "popping". He has avoided lifting and has "taking it easy" since injury although pain has gradually worsened. He is unable to sit without experiencing excruciating pain. He has denies applied ice and heat without significant relief of pain. Pain is most prominent immediately at the beginning of the upper buttocks. He has no bruising and denies pain with defecation or urination. Pain is exacerbated by changes in positions.   Past Medical History:  Diagnosis Date  . Bronchitis     Patient Active Problem List   Diagnosis Date Noted  . Smoking 05/23/2013  . Other and unspecified hyperlipidemia 05/23/2013    History reviewed. No pertinent surgical history.     Home Medications    Prior to Admission medications   Medication Sig Start Date End Date Taking? Authorizing Provider  Diclofenac Sodium 3 % GEL Place 1 Tube onto the skin 2 (two) times daily. 02/23/13   Reyne Dumas, MD  nicotine (NICODERM CQ) 21 mg/24hr patch Place 1 patch (21 mg total) onto the skin daily. 05/23/13   Lorayne Marek, MD  oxyCODONE-acetaminophen (PERCOCET/ROXICET) 5-325 MG per tablet Take 1-2 tablets by mouth every 6 (six) hours as needed for severe pain. 01/29/13   Kingsley Spittle, MD    Family History Family History  Problem Relation Age of Onset  . Hypertension Mother   . Hypertension Father   . Diabetes Father     Social History Social History   Tobacco Use  . Smoking status: Current Every Day Smoker  . Smokeless tobacco: Never Used  Substance Use Topics  . Alcohol use: Yes  . Drug use: No     Allergies   Patient has no known allergies.   Review of Systems Review of Systems   Physical Exam Triage Vital Signs ED Triage Vitals  Enc Vitals Group     BP 11/14/18 1122 138/89     Pulse Rate 11/14/18 1122 87     Resp 11/14/18 1122 16     Temp 11/14/18 1122 99.1 F (37.3 C)     Temp Source 11/14/18 1122 Temporal     SpO2 11/14/18 1122 100 %     Weight --      Height --      Head Circumference --      Peak Flow --      Pain Score 11/14/18 1119 9     Pain Loc --      Pain Edu? --      Excl. in [place]? --    No data found.  Updated Vital Signs BP 138/89 (BP Location: Left Arm)   Pulse 87   Temp 99.1 F (37.3 C) (Temporal)   Resp 16   SpO2 100%   Visual Acuity Right Eye Distance:   Left Eye Distance:   Bilateral Distance:    Right Eye Near:   Left Eye Near:    Bilateral Near:     Physical Exam General appearance: alert, well developed, well nourished, cooperative and in no distress Head: Normocephalic, without obvious abnormality, atraumatic Respiratory: Respirations even and unlabored, normal respiratory rate Heart:  rate and rhythm normal. No gallop or murmurs noted on exam  Abdomen: BS +, no distention, no rebound tenderness, or no mass Extremities: No gross deformities Skin: Skin color, texture, turgor normal. No rashes seen  Psych: Appropriate mood and affect. Neurologic: Mental status: Alert, oriented to person, place, and time, thought content appropriate.  UC Treatments / Results  Labs (all labs ordered are listed, but only abnormal results are displayed) Labs Reviewed - No data to display  EKG   Radiology Dg Sacrum/coccyx  Result Date: 11/14/2018 CLINICAL DATA:  Persistent sacrococcygeal pain after a fall 2 days ago. EXAM: SACRUM AND COCCYX - 2+ VIEW COMPARISON:  None. FINDINGS: There is suggestion of an oblique fracture of the mid sacrum but this is not confirmed on the AP views. Limited CT scan through the sacrum and coccyx may be useful. IMPRESSION: Possible mid sacral fracture.  Electronically Signed   By: Francene BoyersJames  Maxwell M.D.   On: 11/14/2018 12:30    Procedures Procedures (including critical care time)  Medications Ordered in UC Medications - No data to display  Initial Impression / Assessment and Plan / UC Course  I have reviewed the triage vital signs and the nursing notes.  Pertinent labs & imaging results that were available during my care of the patient were reviewed by me and considered in my medical decision making (see chart for details).  X-ray imaging inconclusive in ruling out fracture. Will prescribe a short course of tramadol for pain. Patient is being referred to the ER for further imaging and work-up for possible sacrum fracture. Patient verbalizes understanding and agreement with plan.    Final Clinical Impressions(s) / UC Diagnoses   Final diagnoses:  Injury of sacrum, initial encounter     Discharge Instructions     [facility] for further imaging due to x-ray imaging of your sacrum and coccyx was inconclusive in ruling out a fracture. I have prescribed Tramadol 50 mg every 6 hours for pain. Take only if pain is severe. You can take over the counter tylenol and or naproxen for pain.    ED Prescriptions    Medication Sig Dispense Auth. Provider   traMADol (ULTRAM) 50 MG tablet Take 1 tablet (50 mg total) by mouth every 6 (six) hours as needed. 15 tablet Bing NeighborsHarris, Juni Glaab S, FNP     Controlled Substance Prescriptions  Controlled Substance Registry consulted? Not Applicable   Bing NeighborsHarris, Letisia Schwalb S, FNP 11/15/18 0003

## 2018-11-14 NOTE — ED Triage Notes (Signed)
Pt states he fell on Thursday while outside going down a step.  C/o "tailbone" pain.  States he was seen at [facility] today with negative x-rays and sent to the ED for a CT.

## 2018-11-14 NOTE — Discharge Instructions (Signed)
Your CT scan is reading as a possible subtle fracture to your right lower sacrum. Take the tramadol as prescribed by urgent care as needed for pain. You can take Advil every 6 hours in addition to this. Apply ice for 20 minutes at a time. You can find a donut shaped pillow to relieve the pressure on your bottom at any pharmacy or store such as [place]. Follow-up with primary care as needed.   If you have any new numbness or weakness in your extremities, bowel or bladder incontinence, numbness under your bottom, please return to the emergency department.

## 2018-11-14 NOTE — Discharge Instructions (Signed)
Follow-up immediately at [facility] for further imaging due to x-ray imaging of your sacrum and coccyx was inconclusive in ruling out a fracture. I have prescribed Tramadol 50 mg every 6 hours for pain. Take only if pain is severe. You can take over the counter tylenol and or naproxen for pain.

## 2018-11-14 NOTE — ED Triage Notes (Signed)
Patient presents to Urgent Care with complaints of "tailbone pain... I think I cracked by crack!" since two days ago. Patient reports his feet slipped out from under him and he is worried he may have cracked his tailbone.

## 2018-11-14 NOTE — ED Notes (Signed)
Patient transported to CT 

## 2018-11-14 NOTE — ED Provider Notes (Signed)
[facility] Provider Note   CSN: [id number] Arrival date & time: 11/14/18  1304     History   Chief Complaint Chief Complaint  Patient presents with  . Tailbone Pain    HPI Frank Chavez is a 43 y.o. male presenting to the emergency department from urgent care with complaint of low back/tailbone injury that occurred on Thursday.  Patient states he was walking down a hill and tripped falling directly onto his bottom very hard.  He has had pain to his bottom just at the upper portion of his gluteal cleft that is worse with sitting and movement.  He has been treating his symptoms with over-the-counter medications without much relief.  He was seen at urgent care today with an x-ray that concerning for possible mid sacral fracture.  He denies any numbness or weakness in extremities, saddle paresthesia, bowel or bladder incontinence, abdominal pain.  He has no problems with walking though it just causes pain.     The history is provided by the patient and medical records.    Past Medical History:  Diagnosis Date  . Bronchitis     Patient Active Problem List   Diagnosis Date Noted  . Smoking 05/23/2013  . Other and unspecified hyperlipidemia 05/23/2013    History reviewed. No pertinent surgical history.      Home Medications    Prior to Admission medications   Medication Sig Start Date End Date Taking? Authorizing Provider  Diclofenac Sodium 3 % GEL Place 1 Tube onto the skin 2 (two) times daily. 02/23/13   Richarda OverlieAbrol, Nayana, MD  nicotine (NICODERM CQ) 21 mg/24hr patch Place 1 patch (21 mg total) onto the skin daily. 05/23/13   Doris CheadleAdvani, Deepak, MD  traMADol (ULTRAM) 50 MG tablet Take 1 tablet (50 mg total) by mouth every 6 (six) hours as needed. 11/14/18   Bing NeighborsHarris, Kimberly S, FNP  oxyCODONE-acetaminophen (PERCOCET/ROXICET) 5-325 MG per tablet Take 1-2 tablets by mouth every 6 (six) hours as needed for severe pain. 01/29/13 11/14/18  Quita SkyeGhim,  Michael, MD    Family History Family History  Problem Relation Age of Onset  . Hypertension Mother   . Hypertension Father   . Diabetes Father     Social History Social History   Tobacco Use  . Smoking status: Current Every Day Smoker  . Smokeless tobacco: Never Used  Substance Use Topics  . Alcohol use: Yes  . Drug use: No     Allergies   Patient has no known allergies.   Review of Systems Review of Systems  Musculoskeletal: Positive for back pain.  All other systems reviewed and are negative.    Physical Exam Updated Vital Signs BP (!) 143/102 (BP Location: Left Arm)   Pulse 72   Temp 98.7 F (37.1 C) (Oral)   Resp 20   SpO2 100%   Physical Exam Vitals signs and nursing note reviewed.  Constitutional:      General: He is not in acute distress.    Appearance: He is well-developed.  HENT:     Head: Normocephalic and atraumatic.  Eyes:     Conjunctiva/sclera: Conjunctivae normal.  Cardiovascular:     Rate and Rhythm: Normal rate and regular rhythm.  Pulmonary:     Effort: Pulmonary effort is normal. No respiratory distress.     Breath sounds: Normal breath sounds.  Abdominal:     Palpations: Abdomen is soft.  Musculoskeletal:     Comments: There is tenderness to the sacral  region at the gluteal cleft.  There are no skin changes, swelling, this or wounds.  There is no obvious deformity.  No tenderness of the L-spine.  Skin:    General: Skin is warm.  Neurological:     Mental Status: He is alert.     Comments: Normal tone.  5/5 strength in BUE and BLE including strong and equal grip strength and dorsiflexion/plantar flexion Sensory: Pinprick and light touch normal in BLE extremities.  Gait: normal gait and balance CV: distal pulses palpable throughout    Psychiatric:        Behavior: Behavior normal.      ED Treatments / Results  Labs (all labs ordered are listed, but only abnormal results are displayed) Labs Reviewed - No data to display   EKG None  Radiology Dg Sacrum/coccyx  Result Date: 11/14/2018 CLINICAL DATA:  Persistent sacrococcygeal pain after a fall 2 days ago. EXAM: SACRUM AND COCCYX - 2+ VIEW COMPARISON:  None. FINDINGS: There is suggestion of an oblique fracture of the mid sacrum but this is not confirmed on the AP views. Limited CT scan through the sacrum and coccyx may be useful. IMPRESSION: Possible mid sacral fracture. Electronically Signed   By: Lorriane Shire M.D.   On: 11/14/2018 12:30   Ct Pelvis Wo Contrast  Result Date: 11/14/2018 CLINICAL DATA:  Fall while walking down steps 3 days ago with possible sacral fracture. EXAM: CT PELVIS WITHOUT CONTRAST TECHNIQUE: Multidetector CT imaging of the pelvis was performed following the standard protocol without intravenous contrast. COMPARISON:  Plain films today. FINDINGS: Urinary Tract:  No abnormality visualized. Bowel:  Unremarkable visualized pelvic bowel loops. Vascular/Lymphatic: No pathologically enlarged lymph nodes. No significant vascular abnormality seen. Reproductive:  No mass or other significant abnormality Other: Partially visualized small umbilical hernia containing only mesenteric fat. 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 Possible subtle fracture without significant displacement on the right side of the lower sacrum seen best on the sagittal image. Remaining bony structures are unremarkable. IMPRESSION: Possible subtle fracture along the right side of the lower sacrum without significant displacement. 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 Electronically Signed   By: Marin Olp M.D.   On: 11/14/2018 15:17    Procedures Procedures (including critical care time)  Medications Ordered in ED Medications - No data to display   Initial Impression / Assessment and Plan / ED Course  I have reviewed the triage vital signs and the nursing notes.   Pertinent labs & imaging results that were available during my care of the patient were reviewed by me and considered in my medical decision making (see chart for details).        Patient presenting from urgent care with possible sacral fracture after a fall on Thursday.  No neuro deficits or red flags.  CT scan was ordered for further evaluation which reveals possible subtle fracture of the right side of the lower sacrum without displacement.  These results were discussed with patient and recommendations for symptomatic management.  Urgent care prescribed him tramadol prior to arrival to the ED.  Encourage you take that as prescribed.  Discussed cushion for support with sitting.  Work note provided.  Outpatient referral provided for neurosurgery as patient does not have PCP to follow-up.  Return precautions discussed.  Patient is agreeable to plan and safe for discharge.  Pt discussed with Dr. Tamera Punt.  Discussed  results, findings, treatment and follow up. Patient advised of return precautions. Patient verbalized understanding and agreed with plan.   Final Clinical Impressions(s) / ED Diagnoses   Final diagnoses:  Closed fracture of sacrum, unspecified fracture morphology, initial encounter [facility])    ED Discharge Orders    None       Ayrabella Labombard, [place] N, PA-C 11/14/18 1547    Rolan Bucco, MD 11/14/18 1710

## 2020-12-04 ENCOUNTER — Emergency Department (HOSPITAL_COMMUNITY): Payer: Medicaid Other

## 2020-12-04 ENCOUNTER — Encounter (HOSPITAL_COMMUNITY): Payer: Self-pay | Admitting: Emergency Medicine

## 2020-12-04 ENCOUNTER — Other Ambulatory Visit: Payer: Self-pay

## 2020-12-04 ENCOUNTER — Emergency Department (HOSPITAL_COMMUNITY)
Admission: EM | Admit: 2020-12-04 | Discharge: 2020-12-04 | Disposition: A | Discharge status: Home / Self Care | Payer: Medicaid Other | Attending: Emergency Medicine | Admitting: Emergency Medicine

## 2020-12-04 DIAGNOSIS — D72829 Elevated white blood cell count, unspecified: Secondary | ICD-10-CM | POA: Insufficient documentation

## 2020-12-04 DIAGNOSIS — N200 Calculus of kidney: Secondary | ICD-10-CM | POA: Diagnosis not present

## 2020-12-04 DIAGNOSIS — N132 Hydronephrosis with renal and ureteral calculous obstruction: Secondary | ICD-10-CM | POA: Insufficient documentation

## 2020-12-04 DIAGNOSIS — F17200 Nicotine dependence, unspecified, uncomplicated: Secondary | ICD-10-CM | POA: Diagnosis not present

## 2020-12-04 DIAGNOSIS — R109 Unspecified abdominal pain: Secondary | ICD-10-CM | POA: Diagnosis present

## 2020-12-04 LAB — COMPREHENSIVE METABOLIC PANEL
ALT: 29 U/L (ref 0–44)
AST: 26 U/L (ref 15–41)
Albumin: 4.6 g/dL (ref 3.5–5.0)
Alkaline Phosphatase: 52 U/L (ref 38–126)
Anion gap: 10 (ref 5–15)
BUN: 12 mg/dL (ref 6–20)
CO2: 22 mmol/L (ref 22–32)
Calcium: 9.5 mg/dL (ref 8.9–10.3)
Chloride: 105 mmol/L (ref 98–111)
Creatinine, Ser: 0.99 mg/dL (ref 0.61–1.24)
GFR, Estimated: >60 mL/min (ref >60)
Glucose, Bld: 136 mg/dL — ABNORMAL HIGH (ref 70–99)
Potassium: 3.7 mmol/L (ref 3.5–5.1)
Sodium: 137 mmol/L (ref 135–145)
Total Bilirubin: 0.6 mg/dL (ref 0.3–1.2)
Total Protein: 7.7 g/dL (ref 6.5–8.1)

## 2020-12-04 LAB — CBC WITH DIFFERENTIAL/PLATELET
Abs Immature Granulocytes: 0.12 10*3/uL — ABNORMAL HIGH (ref 0.00–0.07)
Basophils Absolute: 0.1 10*3/uL (ref 0.0–0.1)
Basophils Relative: 1 %
Eosinophils Absolute: 0.1 10*3/uL (ref 0.0–0.5)
Eosinophils Relative: 0 %
HCT: 45.9 % (ref 39.0–52.0)
Hemoglobin: 15.5 g/dL (ref 13.0–17.0)
Immature Granulocytes: 1 %
Lymphocytes Relative: 11 %
Lymphs Abs: 1.8 10*3/uL (ref 0.7–4.0)
MCH: 30.7 pg (ref 26.0–34.0)
MCHC: 33.8 g/dL (ref 30.0–36.0)
MCV: 90.9 fL (ref 80.0–100.0)
Monocytes Absolute: 1.1 10*3/uL — ABNORMAL HIGH (ref 0.1–1.0)
Monocytes Relative: 6 %
Neutro Abs: 13.9 10*3/uL — ABNORMAL HIGH (ref 1.7–7.7)
Neutrophils Relative %: 81 %
Platelets: 222 10*3/uL (ref 150–400)
RBC: 5.05 MIL/uL (ref 4.22–5.81)
RDW: 12.8 % (ref 11.5–15.5)
WBC: 17.1 10*3/uL — ABNORMAL HIGH (ref 4.0–10.5)
nRBC: 0 % (ref 0.0–0.2)

## 2020-12-04 LAB — URINALYSIS, ROUTINE W REFLEX MICROSCOPIC
Bacteria, UA: NONE SEEN
Bilirubin Urine: NEGATIVE
Glucose, UA: NEGATIVE mg/dL
Ketones, ur: NEGATIVE mg/dL
Leukocytes,Ua: NEGATIVE
Nitrite: NEGATIVE
Protein, ur: >=300 mg/dL — AB
RBC / HPF: >50 RBC/hpf — ABNORMAL HIGH (ref 0–5)
Specific Gravity, Urine: 1.016 (ref 1.005–1.030)
pH: 6 (ref 5.0–8.0)

## 2020-12-04 LAB — LIPASE, BLOOD: Lipase: 33 U/L (ref 11–51)

## 2020-12-04 MED ORDER — KETOROLAC TROMETHAMINE 30 MG/ML IJ SOLN
30.0000 mg | Freq: Once | INTRAMUSCULAR | Status: AC
  Administered 2020-12-04: 30 mg via INTRAMUSCULAR

## 2020-12-04 MED ORDER — KETOROLAC TROMETHAMINE 60 MG/2ML IM SOLN: 60.0000 mg | Freq: Once | INTRAMUSCULAR | Status: DC

## 2020-12-04 MED ORDER — MORPHINE SULFATE (PF) 2 MG/ML IV SOLN: 2.0000 mg | Freq: Once | INTRAVENOUS | Status: DC

## 2020-12-04 MED ORDER — LACTATED RINGERS IV BOLUS
1000.0000 mL | Freq: Once | INTRAVENOUS | Status: AC
  Administered 2020-12-04: 1000 mL via INTRAVENOUS

## 2020-12-04 MED ORDER — MORPHINE SULFATE (PF) 2 MG/ML IV SOLN
2.0000 mg | Freq: Once | INTRAVENOUS | Status: AC
  Administered 2020-12-04: 2 mg via INTRAMUSCULAR
  Filled 2020-12-04: qty 1

## 2020-12-04 MED ORDER — TAMSULOSIN HCL 0.4 MG PO CAPS: 0.4000 mg | ORAL_CAPSULE | Freq: Every day | ORAL | 0 refills | Status: AC

## 2020-12-04 MED ORDER — KETOROLAC TROMETHAMINE 30 MG/ML IJ SOLN
30.0000 mg | Freq: Once | INTRAMUSCULAR | Status: DC
  Filled 2020-12-04: qty 1

## 2020-12-04 MED ORDER — TAMSULOSIN HCL 0.4 MG PO CAPS
0.4000 mg | ORAL_CAPSULE | Freq: Once | ORAL | Status: AC
  Administered 2020-12-04: 0.4 mg via ORAL
  Filled 2020-12-04: qty 1

## 2020-12-04 NOTE — ED Provider Notes (Signed)
Emergency Medicine Provider Triage Evaluation Note  ADOLFO GRANIERI , a 45 y.o. male  was evaluated in triage.  Pt complains of right flank pain radiating down to right groin onset 3 hours ago, no clear inciting event.  Describes pain as sharp constant no clear aggravating or alleviating factors.  Denies similar pain in the past.  Associated with nausea.  Review of Systems  Positive: Right flank pain, nausea Negative: Fever, chills, fall, injury, vomiting, diarrhea, dysuria/hematuria, testicular swelling/pain or any additional concerns.  Physical Exam  There were no vitals taken for this visit. Gen:   Awake, uncomfortable. Resp:  Normal effort  MSK:   Moves extremities without difficulty  Other:  Abdomen soft nontender.  No midline spinal tenderness palpation.  No evidence of injury of the back or abdomen.  Right CVA tenderness noted.  Testicular examination deferred by patient.  Medical Decision Making  Medically screening exam initiated at 11:20 AM.  Appropriate orders placed.  JACQUEZ SHEETZ was informed that the remainder of the evaluation will be completed by another provider, this initial triage assessment does not replace that evaluation, and the importance of remaining in the ED until their evaluation is complete.  Risks versus benefits of CT renal stone study were discussed with patient.  Patient stated understanding and agreed for CT scan.  Note: Portions of this report may have been transcribed using voice recognition software. Every effort was made to ensure accuracy; however, inadvertent computerized transcription errors may still be present.    Elizabeth Palau 12/04/20 1122    Wynetta Fines, MD 12/04/20 1428

## 2020-12-04 NOTE — ED Provider Notes (Signed)
[facility] EMERGENCY DEPARTMENT Provider Note   CSN: [id number] Arrival date & time: 12/04/20  1106     History Chief Complaint  Patient presents with   Abdominal Pain    Frank Chavez is a 45 y.o. 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  Patient states that pain began at approximately 8 AM this morning with severe 10 out of 10 and radiating down to the groin.  He states he has been able to urinate but there is pain with urination.  Denies chest pain, shortness of breath, abdominal pain, nausea, vomiting or other systemic symptoms.  No exacerbating factors, alleviating factors include Toradol in the lobby.   Abdominal Pain Associated symptoms: dysuria   Associated symptoms: no chest pain, no chills, no cough, no fever, no hematuria, no shortness of breath, no sore throat and no vomiting       Past Medical History:  Diagnosis Date   Bronchitis     Patient Active Problem List   Diagnosis Date Noted   Smoking 05/23/2013   Other and unspecified hyperlipidemia 05/23/2013    No past surgical history on file.     Family History  Problem Relation Age of Onset   Hypertension Mother    Hypertension Father    Diabetes Father     Social History   Tobacco Use   Smoking status: Every Day   Smokeless tobacco: Never  Vaping Use   Vaping Use: Never used  Substance Use Topics   Alcohol use: Yes   Drug use: No    Home Medications Prior to Admission medications   Medication Sig Start Date End Date Taking? Authorizing Provider  tamsulosin (FLOMAX) 0.4 MG CAPS capsule Take 1 capsule (0.4 mg total) by mouth daily. 12/04/20  Yes Aja Whitehair, MD  Diclofenac Sodium 3 % GEL Place 1 Tube onto the skin 2 (two) times daily. 02/23/13   Richarda Overlie, MD  nicotine (NICODERM CQ) 21 mg/24hr patch Place 1 patch (21 mg total) onto the skin daily. 05/23/13   Doris Cheadle,  MD  traMADol (ULTRAM) 50 MG tablet Take 1 tablet (50 mg total) by mouth every 6 (six) hours as needed. 11/14/18   Bing Neighbors, FNP  oxyCODONE-acetaminophen (PERCOCET/ROXICET) 5-325 MG per tablet Take 1-2 tablets by mouth every 6 (six) hours as needed for severe pain. 01/29/13 11/14/18  Quita Skye, MD    Allergies    Patient has no known allergies.  Review of Systems   Review of Systems  Constitutional:  Negative for chills and fever.  HENT:  Negative for ear pain and sore throat.   Eyes:  Negative for pain and visual disturbance.  Respiratory:  Negative for cough and shortness of breath.   Cardiovascular:  Negative for chest pain and palpitations.  Gastrointestinal:  Positive for abdominal pain. Negative for vomiting.  Genitourinary:  Positive for dysuria and flank pain. Negative for hematuria.  Musculoskeletal:  Negative for arthralgias and back pain.  Skin:  Negative for color change and rash.  Neurological:  Negative for seizures and syncope.  All other systems reviewed and are negative.  Physical Exam Updated Vital Signs BP (!) 156/92 (BP Location: Left Arm)   Pulse (!) 59   Temp 98.4 F (36.9 C) (Oral)   Resp (!) 22   SpO2 99%   Physical Exam Vitals and nursing note reviewed.  Constitutional:  Appearance: He is well-developed.  HENT:     Head: Normocephalic and atraumatic.  Eyes:     Conjunctiva/sclera: Conjunctivae normal.  Cardiovascular:     Rate and Rhythm: Normal rate and regular rhythm.     Heart sounds: No murmur heard. Pulmonary:     Effort: Pulmonary effort is normal. No respiratory distress.     Breath sounds: Normal breath sounds.  Abdominal:     Palpations: Abdomen is soft.     Tenderness: There is no abdominal tenderness. There is right CVA tenderness.  Musculoskeletal:     Cervical back: Neck supple.  Skin:    General: Skin is warm and dry.  Neurological:     Mental Status: He is alert.    ED Results / Procedures / Treatments    Labs (all labs ordered are listed, but only abnormal results are displayed) Labs Reviewed  CBC WITH DIFFERENTIAL/PLATELET - Abnormal; Notable for the following components:      Result Value   WBC 17.1 (*)    Neutro Abs 13.9 (*)    Monocytes Absolute 1.1 (*)    Abs Immature Granulocytes 0.12 (*)    All other components within normal limits  COMPREHENSIVE METABOLIC PANEL - Abnormal; Notable for the following components:   Glucose, Bld 136 (*)    All other components within normal limits  URINALYSIS, ROUTINE W REFLEX MICROSCOPIC - Abnormal; Notable for the following components:   APPearance HAZY (*)    Hgb urine dipstick MODERATE (*)    Protein, ur >=300 (*)    RBC / HPF >50 (*)    All other components within normal limits  LIPASE, BLOOD    EKG None  Radiology CT Renal Stone Study  Result Date: 12/04/2020 CLINICAL DATA:  Right lower quadrant abdominal pain. EXAM: CT ABDOMEN AND PELVIS WITHOUT CONTRAST TECHNIQUE: Multidetector CT imaging of the abdomen and pelvis was performed following the standard protocol without IV contrast. COMPARISON:  November 14, 2018. FINDINGS: Lower chest: No acute abnormality. Hepatobiliary: No focal liver abnormality is seen. No gallstones, gallbladder wall thickening, or biliary dilatation. Pancreas: Unremarkable. No pancreatic ductal dilatation or surrounding inflammatory changes. Spleen: Normal in size without focal abnormality. Adrenals/Urinary Tract: Adrenal glands appear normal. 9 mm nonobstructive left renal calculus is noted. Severe left hydronephrosis is noted without ureteral dilatation or obstructing calculus, most consistent with ureteropelvic junction obstruction. Mild right hydroureteronephrosis is noted secondary to 3 mm calculus at right ureterovesical junction. Urinary bladder is decompressed. Stomach/Bowel: Stomach is within normal limits. Appendix appears normal. No evidence of bowel wall thickening, distention, or inflammatory changes.  Vascular/Lymphatic: No significant vascular findings are present. No enlarged abdominal or pelvic lymph nodes. Reproductive: Prostate is unremarkable. Other: Moderate size fat containing periumbilical hernia is noted. No ascites is noted. Musculoskeletal: No acute or significant osseous findings. IMPRESSION: Mild right hydroureteronephrosis is noted secondary to 3 mm calculus at right ureterovesical junction. Severe left hydronephrosis is noted without ureteral dilatation or obstructing calculus, most consistent with ureteropelvic junction stenosis. 9 mm nonobstructive left renal calculus is noted. Moderate size fat containing periumbilical hernia. Electronically Signed   By: Lupita Raider M.D.   On: 12/04/2020 12:34    Procedures Procedures   Medications Ordered in ED Medications  morphine 2 MG/ML injection 2 mg (2 mg Intramuscular Given 12/04/20 1128)  ketorolac (TORADOL) 30 MG/ML injection 30 mg (30 mg Intramuscular Given 12/04/20 1649)  lactated ringers bolus 1,000 mL (0 mLs Intravenous Stopped 12/04/20 1857)  tamsulosin (FLOMAX) capsule 0.4 mg (  0.4 mg Oral Given 12/04/20 1856)    ED Course  I have reviewed the triage vital signs and the nursing notes.  Pertinent labs & imaging results that were available during my care of the patient were reviewed by me and considered in my medical decision making (see chart for details).  Clinical Course as of 12/04/20 1906  Tue Dec 04, 2020  1831 Dr. Alvester Morin [MK]    Clinical Course User Index [MK] Jyron Turman, Wyn Forster, MD   MDM Rules/Calculators/A&P                           Patient seen the emergency department for evaluation of right flank pain.  Physical exam reveals right CVA tenderness but is otherwise unremarkable.  Laboratory evaluation with leukocytosis to 17.1 and in the setting of a noninfected urinalysis is likely secondary to stress demargination from the pain.  Urinalysis with blood and no evidence of infection, chemistry with a normal  creatinine of 0.99.  CT stone study showing mild right hydroureteronephrosis secondary to a 3 mm stone at the right UVJ as well as severe left hydro without ureteral dilatation or obstructing calculus consistent with the previously described UPJ stenosis.  The patient's current right-sided kidney stone is a potential problem in the setting of a left-sided UPJ obstruction and I spoke with urology (Dr. Alvester Morin) who agrees that the patient is likely about to pass the stone as it is sitting right at the UVJ and we will opt for fluid resuscitation, Flomax in the ER with very close outpatient urology follow-up tomorrow.  Patient will follow up with Dr. Alvester Morin tomorrow and he was discharged with a prescription for Flomax. Final Clinical Impression(s) / ED Diagnoses Final diagnoses:  Nephrolithiasis    Rx / DC Orders ED Discharge Orders          Ordered    tamsulosin (FLOMAX) 0.4 MG CAPS capsule  Daily        12/04/20 1835             Glendora Score, MD 12/04/20 1908

## 2020-12-04 NOTE — ED Triage Notes (Signed)
Patient here with with complaint of sharp RLQ abdominal pain with radiation to back. Denies hematuria.

## 2020-12-04 NOTE — Discharge Instructions (Signed)
You were seen in the emergency department for evaluation of flank pain.  Your scans today show normal kidney function but shows a 3 mm stone just about to drop into the bladder on the Right side.  Your CT scan also shows a likely narrowing of the ureter on the left side and the combination of these 2 things can become a problem.  I spoke with the urologist today who would like to see you in clinic tomorrow.  Please call Dr. Alvester Morin at the number above to set up an appointment for tomorrow.  Return the emergency department immediately if you stop urinating, have any confusion, vomiting, fevers or any other concerning symptoms.

## 2021-10-17 ENCOUNTER — Encounter: Payer: Self-pay | Admitting: Emergency Medicine

## 2021-10-17 ENCOUNTER — Emergency Department
Admission: EM | Admit: 2021-10-17 | Discharge: 2021-10-17 | Disposition: A | Discharge status: Home / Self Care | Payer: Medicaid Other | Attending: Emergency Medicine | Admitting: Emergency Medicine

## 2021-10-17 ENCOUNTER — Other Ambulatory Visit: Payer: Self-pay

## 2021-10-17 ENCOUNTER — Emergency Department: Payer: Medicaid Other

## 2021-10-17 DIAGNOSIS — R220 Localized swelling, mass and lump, head: Secondary | ICD-10-CM | POA: Diagnosis present

## 2021-10-17 DIAGNOSIS — H05013 Cellulitis of bilateral orbits: Secondary | ICD-10-CM | POA: Insufficient documentation

## 2021-10-17 DIAGNOSIS — L03213 Periorbital cellulitis: Secondary | ICD-10-CM

## 2021-10-17 LAB — CBC WITH DIFFERENTIAL/PLATELET
Abs Immature Granulocytes: 0.04 10*3/uL (ref 0.00–0.07)
Basophils Absolute: 0.1 10*3/uL (ref 0.0–0.1)
Basophils Relative: 1 %
Eosinophils Absolute: 0.3 10*3/uL (ref 0.0–0.5)
Eosinophils Relative: 3 %
HCT: 44.2 % (ref 39.0–52.0)
Hemoglobin: 15 g/dL (ref 13.0–17.0)
Immature Granulocytes: 0 %
Lymphocytes Relative: 22 %
Lymphs Abs: 2.5 10*3/uL (ref 0.7–4.0)
MCH: 30.2 pg (ref 26.0–34.0)
MCHC: 33.9 g/dL (ref 30.0–36.0)
MCV: 89.1 fL (ref 80.0–100.0)
Monocytes Absolute: 0.9 10*3/uL (ref 0.1–1.0)
Monocytes Relative: 8 %
Neutro Abs: 7.5 10*3/uL (ref 1.7–7.7)
Neutrophils Relative %: 66 %
Platelets: 212 10*3/uL (ref 150–400)
RBC: 4.96 MIL/uL (ref 4.22–5.81)
RDW: 12.5 % (ref 11.5–15.5)
WBC: 11.3 10*3/uL — ABNORMAL HIGH (ref 4.0–10.5)
nRBC: 0 % (ref 0.0–0.2)

## 2021-10-17 LAB — COMPREHENSIVE METABOLIC PANEL
ALT: 21 U/L (ref 0–44)
AST: 21 U/L (ref 15–41)
Albumin: 4.7 g/dL (ref 3.5–5.0)
Alkaline Phosphatase: 42 U/L (ref 38–126)
Anion gap: 7 (ref 5–15)
BUN: 13 mg/dL (ref 6–20)
CO2: 25 mmol/L (ref 22–32)
Calcium: 9.7 mg/dL (ref 8.9–10.3)
Chloride: 105 mmol/L (ref 98–111)
Creatinine, Ser: 0.78 mg/dL (ref 0.61–1.24)
GFR, Estimated: >60 mL/min (ref >60)
Glucose, Bld: 93 mg/dL (ref 70–99)
Potassium: 3.7 mmol/L (ref 3.5–5.1)
Sodium: 137 mmol/L (ref 135–145)
Total Bilirubin: 0.8 mg/dL (ref 0.3–1.2)
Total Protein: 7.9 g/dL (ref 6.5–8.1)

## 2021-10-17 LAB — LACTIC ACID, PLASMA
Lactic Acid, Venous: 0.6 mmol/L (ref 0.5–1.9)
Lactic Acid, Venous: 1.9 mmol/L (ref 0.5–1.9)

## 2021-10-17 MED ORDER — IOHEXOL 300 MG/ML  SOLN
75.0000 mL | Freq: Once | INTRAMUSCULAR | Status: AC | PRN
Start: 2021-10-17 — End: 2021-10-17
  Administered 2021-10-17: 75 mL via INTRAVENOUS

## 2021-10-17 MED ORDER — CEFPODOXIME PROXETIL 200 MG PO TABS: 200.0000 mg | ORAL_TABLET | Freq: Two times a day (BID) | ORAL | 0 refills | Status: AC

## 2021-10-17 MED ORDER — SULFAMETHOXAZOLE-TRIMETHOPRIM 800-160 MG PO TABS
1.0000 | ORAL_TABLET | Freq: Two times a day (BID) | ORAL | 0 refills | Status: AC
Start: 2021-10-17 — End: 2021-10-24

## 2021-10-17 MED ORDER — SODIUM CHLORIDE 0.9 % IV SOLN
3.0000 g | Freq: Once | INTRAVENOUS | Status: AC
  Administered 2021-10-17: 3 g via INTRAVENOUS
  Filled 2021-10-17: qty 8

## 2021-10-17 NOTE — ED Notes (Signed)
Pt reports that for the last several days he has had swelling in his eyes. Pt states that he thought it was a stye initially. Pt reports that it started in the left eye and then moved to the right now is in both eyes. Pt denies changes in vision but states that when he wakes up in the morning his eyes are matted shut.

## 2021-10-17 NOTE — ED Provider Notes (Signed)
[facility] Provider Note  Patient Contact: 3:41 PM (approximate)   History   Facial Swelling   HPI  Frank Chavez is a 46 y.o. male presents to the emergency department with bilateral eye erythema and edema, right worse than left.  Patient reports a history of hordeolum and was concerned that he had a reoccurrence.  He states he has been using warm compresses at home but symptoms have not improved.  He was evaluated at [facility] and was referred to the emergency department for further work-up.      Physical Exam   Triage Vital Signs: ED Triage Vitals  Enc Vitals Group     BP 10/17/21 1400 (!) 138/101     Pulse Rate 10/17/21 1400 71     Resp 10/17/21 1400 18     Temp 10/17/21 1400 97.7 F (36.5 C)     Temp Source 10/17/21 1400 Oral     SpO2 10/17/21 1400 98 %     Weight 10/17/21 1401 224 lb (101.6 kg)     Height 10/17/21 1401 6' (1.829 m)     Head Circumference --      Peak Flow --      Pain Score 10/17/21 1401 0     Pain Loc --      Pain Edu? --      Excl. in GC? --     Most recent vital signs: Vitals:   10/17/21 1400 10/17/21 1745  BP: (!) 138/101 (!) 130/90  Pulse: 71 70  Resp: 18 18  Temp: 97.7 F (36.5 C) 98 F (36.7 C)  SpO2: 98% 98%     General: Alert and in no acute distress. Eyes:  PERRL. EOMI. patient has erythema and edema of the upper and lower eyelids bilaterally, right worse than left.  No chemosis.  No proptosis. Head: No acute traumatic findings ENT:      Nose: No congestion/rhinnorhea.      Mouth/Throat: Mucous membranes are moist. Neck: No stridor. No cervical spine tenderness to palpation. Cardiovascular:  Good peripheral perfusion Respiratory: Normal respiratory effort without tachypnea or retractions. Lungs CTAB. Good air entry to the bases with no decreased or absent breath sounds. Gastrointestinal: Bowel sounds 4 quadrants. Soft and nontender to palpation. No guarding or rigidity. No palpable masses.  No distention. No CVA tenderness. Musculoskeletal: Full range of motion to all extremities.  Neurologic:  No gross focal neurologic deficits are appreciated.  Skin:   No rash noted Other:   ED Results / Procedures / Treatments   Labs (all labs ordered are listed, but only abnormal results are displayed) Labs Reviewed  CBC WITH DIFFERENTIAL/PLATELET - Abnormal; Notable for the following components:      Result Value   WBC 11.3 (*)    All other components within normal limits  COMPREHENSIVE METABOLIC PANEL  LACTIC ACID, PLASMA  LACTIC ACID, PLASMA        RADIOLOGY  I personally viewed and evaluated these images as part of my medical decision making, as well as reviewing the written report by the radiologist.  ED Provider Interpretation: No signs of orbital cellulitis on dedicated CT.   PROCEDURES:  Critical Care performed: No  Procedures   MEDICATIONS ORDERED IN ED: Medications  iohexol (OMNIPAQUE) 300 MG/ML solution 75 mL (75 mLs Intravenous Contrast Given 10/17/21 1643)  Ampicillin-Sulbactam (UNASYN) 3 g in sodium chloride 0.9 % 100 mL IVPB (3 g Intravenous New Bag/Given 10/17/21 1718)     IMPRESSION /  MDM / ASSESSMENT AND PLAN / ED COURSE  I reviewed the triage vital signs and the nursing notes.                              Assessment and plan Eyelid edema and erythema 46 year old male presents to the emergency department with bilateral edema and erythema, right worse than left.  Vital signs are reassuring at triage.  On exam, patient was alert, active and nontoxic-appearing.  He was able to hold both eyes open and had no proptosis.  Low suspicion for orbital cellulitis but will obtain CT orbits with contrast.  Will give IV Unasyn in the emergency department if CT negative for orbital cellulitis.  CBC indicates mildly elevated white blood cell count.  CMP within reference range.  Lactic within reference range.  Care plan will include Bactrim and cefpodoxime at  discharge.  CT orbits not suggestive of orbital cellulitis.  Patient was given IV Unasyn in the emergency department and discharged with cefpodoxime and Bactrim.  Return precautions were given to return with new or worsening symptoms.  Patient was also given a referral to ophthalmology.      FINAL CLINICAL IMPRESSION(S) / ED DIAGNOSES   Final diagnoses:  Periorbital cellulitis, unspecified laterality     Rx / DC Orders   ED Discharge Orders          Ordered    sulfamethoxazole-trimethoprim (BACTRIM DS) 800-160 MG tablet  2 times daily        10/17/21 1743    cefpodoxime (VANTIN) 200 MG tablet  2 times daily        10/17/21 1743             Note:  This document was prepared using Dragon voice recognition software and may include unintentional dictation errors.   Pia Mau [place]pine, PA-C 10/17/21 1749    Minna Antis, MD 10/17/21 [phone]

## 2021-10-17 NOTE — ED Triage Notes (Signed)
Patient to ED for bilateral eye swelling. Hx of styes in both eyes. Patient was sent by [facility] for periorbital cellulitis. No vision changes. No pain unless touching area.

## 2021-10-17 NOTE — Discharge Instructions (Addendum)
Take Bactrim twice daily for the next 7 days. Take Vantin twice daily for the next 7 days. If your symptoms worsen, please return to the emergency department. Please make a follow-up appointment with Dr. Brooke Dare.

## 2023-06-06 IMAGING — CT CT RENAL STONE PROTOCOL
2 of 4 series · 16 of 46 positions shown, 18 images · non-contrast
Comparison: November 14, 2018.

CLINICAL DATA: Right lower quadrant abdominal pain.

EXAM:
CT ABDOMEN AND PELVIS WITHOUT CONTRAST
TECHNIQUE: Multidetector CT imaging of the abdomen and pelvis was performed
following the standard protocol without IV contrast.

[Series 3: renal stone 5.0 · axial · 0.98mm/px · z∈[-358,+82]mm · 13 of 96 slices shown, 15 images]
[im 4/96  soft-tissue]
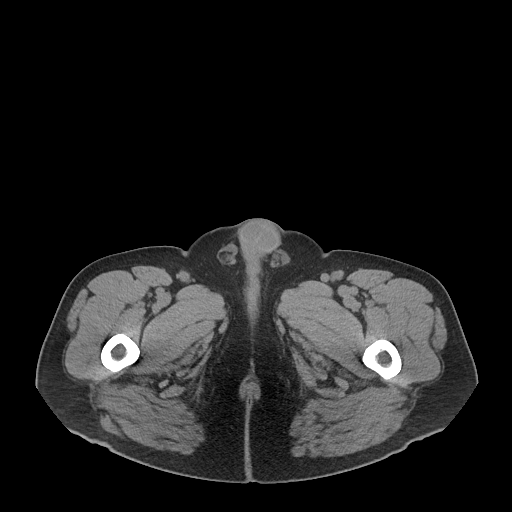
[im 4/96  bone]
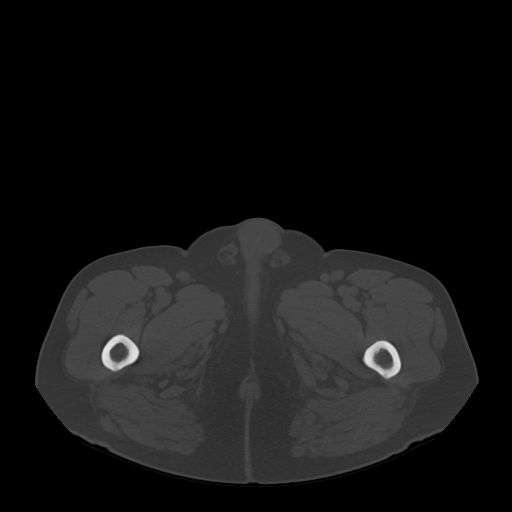
[im 12/96  soft-tissue]
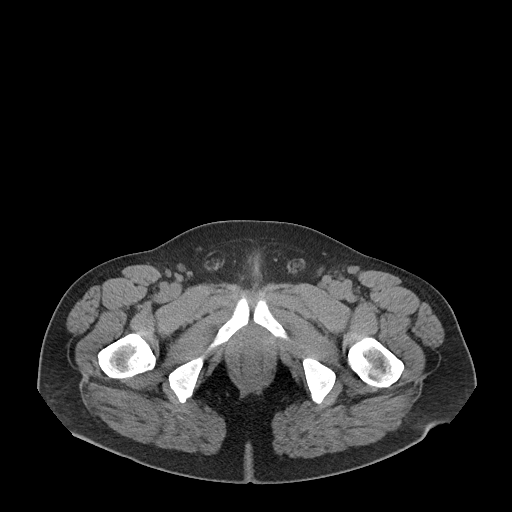
[im 20/96  soft-tissue]
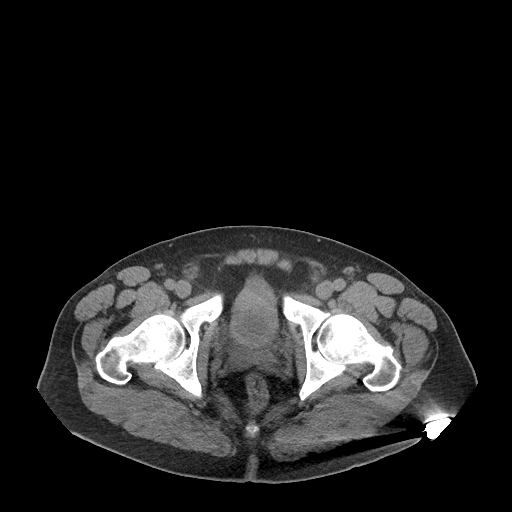
[im 27/96  soft-tissue]
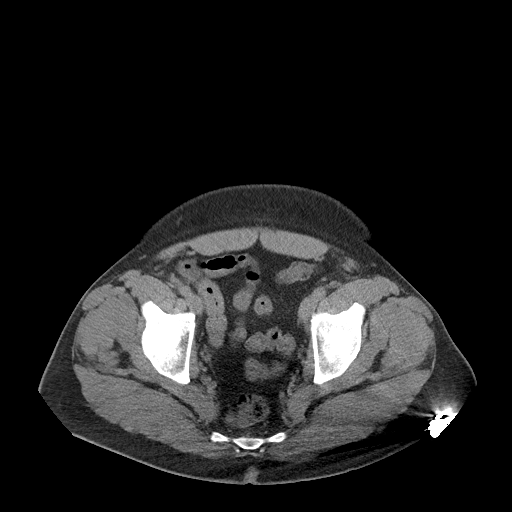
[im 35/96  soft-tissue]
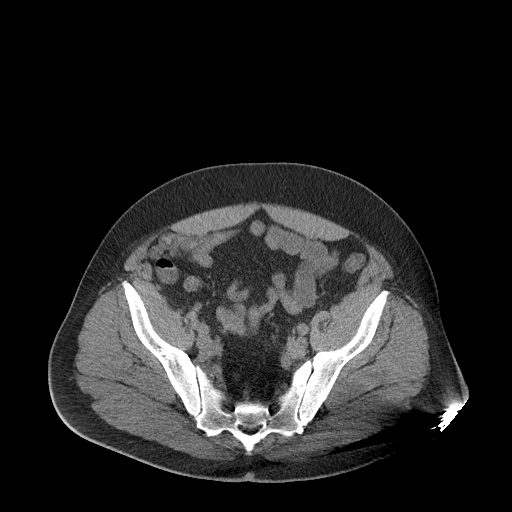
[im 42/96  soft-tissue]
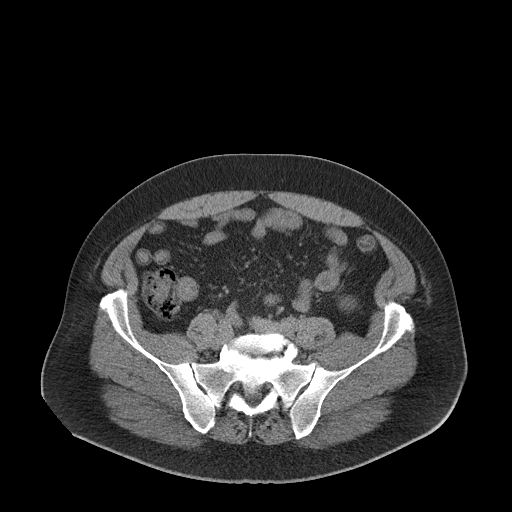
[im 50/96  soft-tissue]
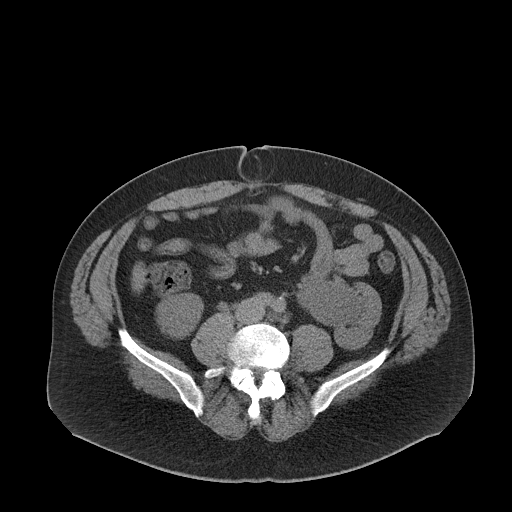
[im 54/96  soft-tissue]
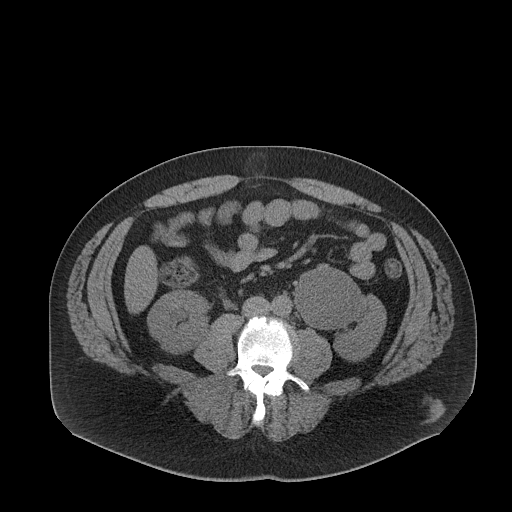
[im 61/96  soft-tissue]
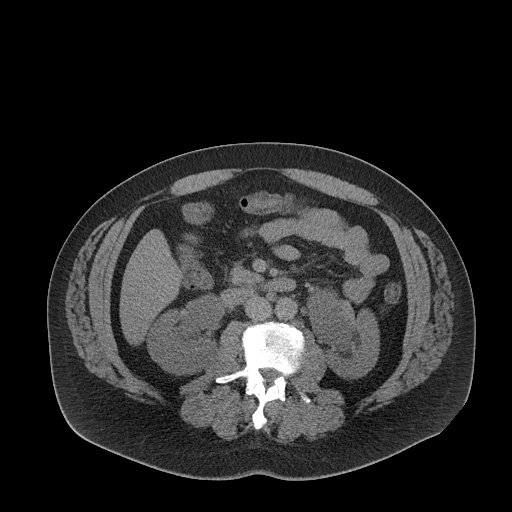
[im 61/96  bone]
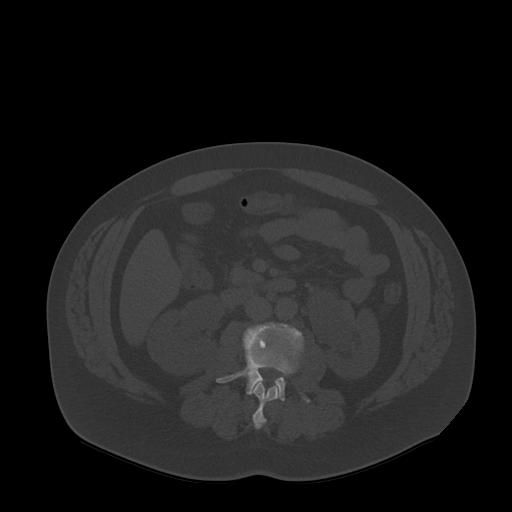
[im 69/96  soft-tissue]
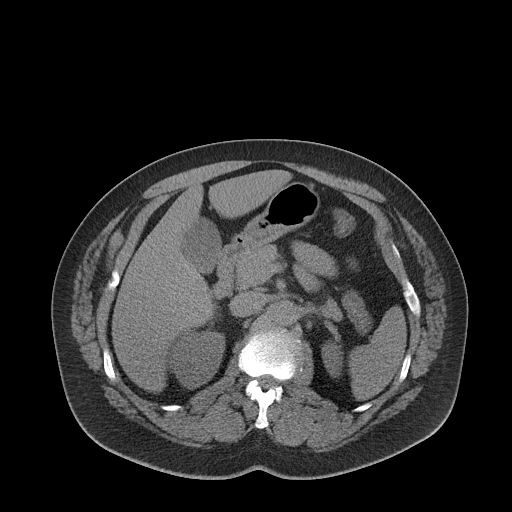
[im 77/96  soft-tissue]
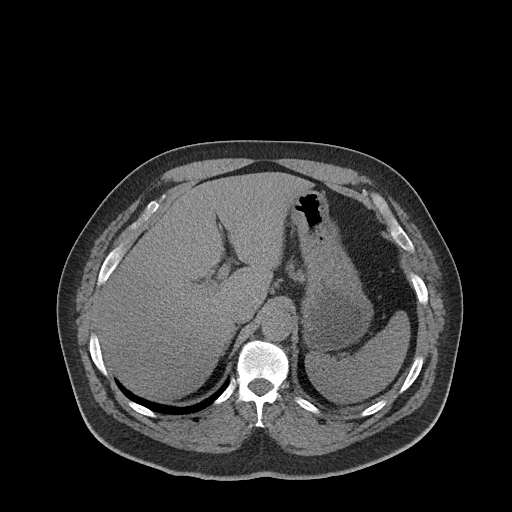
[im 84/96  soft-tissue]
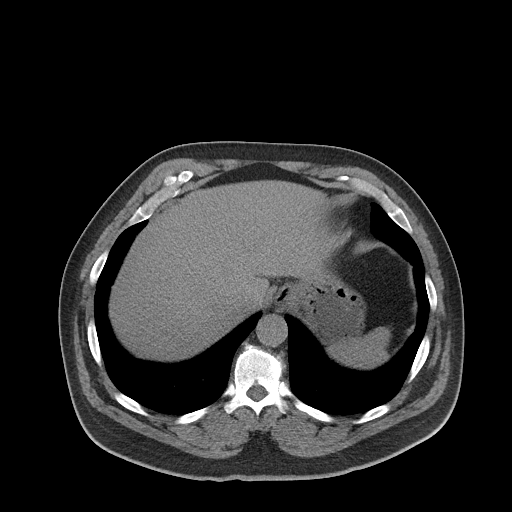
[im 92/96  soft-tissue]
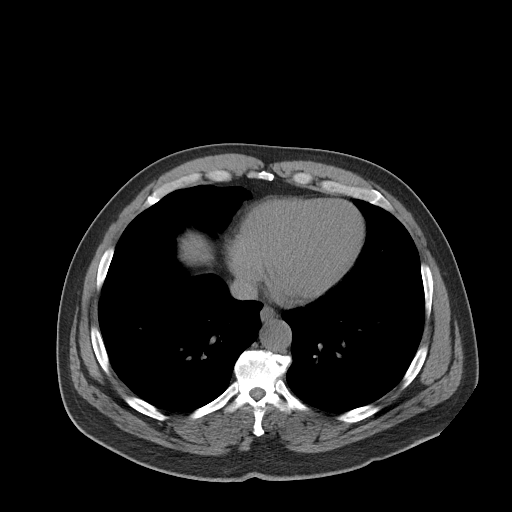

[Series 6: cor · coronal · 0.94mm/px · 3 of 182 slices shown]
[im 61/182  soft-tissue]
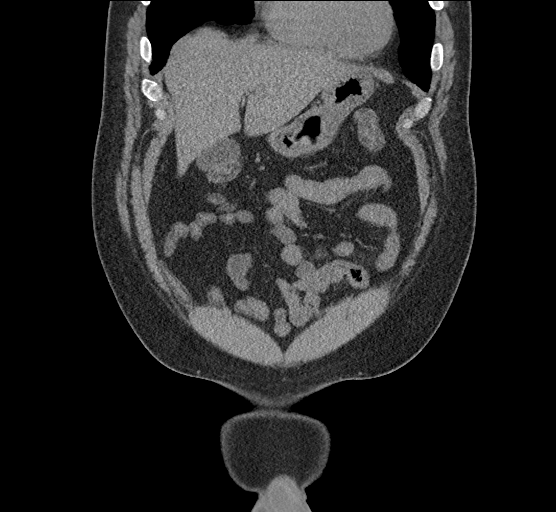
[im 81/182  soft-tissue]
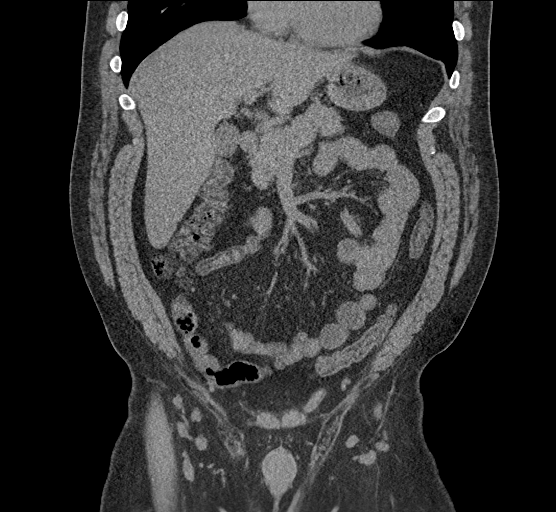
[im 101/182  soft-tissue]
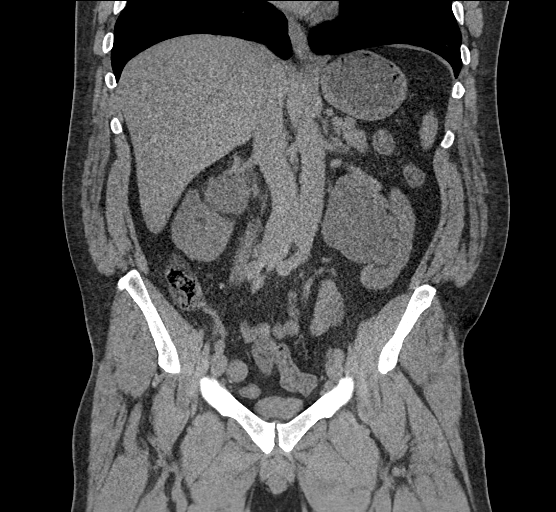

[16 of 46 positions shown; findings below may reference images not displayed]

FINDINGS: Lower chest: No acute abnormality.

Hepatobiliary: No focal liver abnormality is seen. No gallstones,
gallbladder wall thickening, or biliary dilatation.

Pancreas: Unremarkable. No pancreatic ductal dilatation or
surrounding inflammatory changes.

Spleen: Normal in size without focal abnormality.

Adrenals/Urinary Tract: Adrenal glands appear normal. 9 mm
nonobstructive left renal calculus is noted. Severe left
hydronephrosis is noted without ureteral dilatation or obstructing
calculus, most consistent with ureteropelvic junction obstruction.
Mild right hydroureteronephrosis is noted secondary to 3 mm calculus
at right ureterovesical junction. Urinary bladder is decompressed.

Stomach/Bowel: Stomach is within normal limits. Appendix appears
normal. No evidence of bowel wall thickening, distention, or
inflammatory changes.

Vascular/Lymphatic: No significant vascular findings are present. No
enlarged abdominal or pelvic lymph nodes.

Reproductive: Prostate is unremarkable.

Other: Moderate size fat containing periumbilical hernia is noted.
No ascites is noted.

Musculoskeletal: No acute or significant osseous findings.
IMPRESSION: Mild right hydroureteronephrosis is noted secondary to 3 mm calculus
at right ureterovesical junction.

Severe left hydronephrosis is noted without ureteral dilatation or
obstructing calculus, most consistent with ureteropelvic junction
stenosis. 9 mm nonobstructive left renal calculus is noted.

Moderate size fat containing periumbilical hernia.
